# Patient Record
Sex: Female | Born: 1975 | Race: Black or African American | Hispanic: No | Marital: Married | Smoking: Never smoker
Health system: Southern US, Community
[De-identification: ages and names within clinical notes are randomized; demographics above are authoritative.]

## PROBLEM LIST (undated history)

## (undated) ENCOUNTER — Inpatient Hospital Stay (HOSPITAL_COMMUNITY): Payer: Self-pay

## (undated) DIAGNOSIS — N736 Female pelvic peritoneal adhesions (postinfective): Secondary | ICD-10-CM

## (undated) DIAGNOSIS — E229 Hyperfunction of pituitary gland, unspecified: Secondary | ICD-10-CM

## (undated) DIAGNOSIS — Z87820 Personal history of traumatic brain injury: Secondary | ICD-10-CM

## (undated) DIAGNOSIS — R7989 Other specified abnormal findings of blood chemistry: Secondary | ICD-10-CM

## (undated) DIAGNOSIS — I10 Essential (primary) hypertension: Secondary | ICD-10-CM

---

## 1999-05-22 HISTORY — PX: APPENDECTOMY: SHX54

## 2009-04-09 ENCOUNTER — Emergency Department (HOSPITAL_COMMUNITY): Admission: EM | Admit: 2009-04-09 | Discharge: 2009-04-09 | Payer: Self-pay | Admitting: Emergency Medicine

## 2010-04-21 ENCOUNTER — Ambulatory Visit: Payer: Self-pay | Admitting: Obstetrics & Gynecology

## 2010-04-21 ENCOUNTER — Encounter: Payer: Self-pay | Admitting: Physician Assistant

## 2010-04-21 LAB — CONVERTED CEMR LAB: TSH: 1.611 microintl units/mL (ref 0.350–4.500)

## 2010-07-21 ENCOUNTER — Ambulatory Visit: Payer: Self-pay | Admitting: Obstetrics & Gynecology

## 2010-07-21 DIAGNOSIS — N979 Female infertility, unspecified: Secondary | ICD-10-CM

## 2010-08-23 LAB — COMPREHENSIVE METABOLIC PANEL
ALT: 10 U/L (ref 0–35)
Alkaline Phosphatase: 41 U/L (ref 39–117)
CO2: 25 mEq/L (ref 19–32)
GFR calc non Af Amer: >60 mL/min (ref >60)
Glucose, Bld: 72 mg/dL (ref 70–99)
Potassium: 3.8 mEq/L (ref 3.5–5.1)
Sodium: 138 mEq/L (ref 135–145)

## 2010-08-23 LAB — DIFFERENTIAL
Eosinophils Absolute: 0 10*3/uL (ref 0.0–0.7)
Monocytes Absolute: 0.3 10*3/uL (ref 0.1–1.0)
Neutrophils Relative %: 57 % (ref 43–77)

## 2010-08-23 LAB — URINALYSIS, ROUTINE W REFLEX MICROSCOPIC
Glucose, UA: NEGATIVE mg/dL
Ketones, ur: NEGATIVE mg/dL
pH: 7.5 (ref 5.0–8.0)

## 2010-08-23 LAB — CBC
Hemoglobin: 12.1 g/dL (ref 12.0–15.0)
RBC: 5.48 MIL/uL — ABNORMAL HIGH (ref 3.87–5.11)

## 2010-08-23 LAB — LIPASE, BLOOD: Lipase: 20 U/L (ref 11–59)

## 2010-08-23 LAB — POCT PREGNANCY, URINE: Preg Test, Ur: NEGATIVE

## 2010-10-20 ENCOUNTER — Ambulatory Visit (INDEPENDENT_AMBULATORY_CARE_PROVIDER_SITE_OTHER): Payer: Self-pay | Admitting: Obstetrics and Gynecology

## 2010-10-20 ENCOUNTER — Other Ambulatory Visit: Payer: Self-pay | Admitting: Obstetrics and Gynecology

## 2010-10-20 DIAGNOSIS — N979 Female infertility, unspecified: Secondary | ICD-10-CM

## 2010-10-20 DIAGNOSIS — N912 Amenorrhea, unspecified: Secondary | ICD-10-CM

## 2010-10-21 NOTE — Group Therapy Note (Signed)
NAMEDULA, HAVLIK NO.:  [id number]  MEDICAL RECORD NO.:  [id number]           PATIENT TYPE:  A  LOCATION:  [facility]                   FACILITY:  [facility]  PHYSICIAN:  Argentina Donovan, MD        DATE OF BIRTH:  1975-08-30  DATE OF SERVICE:                                 CLINIC NOTE  The patient is a 35 year old African female from [place] who is in for primary infertility.  This is a followup visit after having seen Maylon Cos.  She was started on Clomid for PCOS the last time in discussing with the patient, she seems to have regular periods, although relatively long cycle.  So I am going to continue her on her Clomid. She has  normal FSH, normal prolactin, normal testosterone, free testosterone as yet.  I am not convinced that this lady truly has polycystic ovarian syndrome, although we will see.  I have started her on a temperature chart which she seems very intelligent and able to understand.  We are going to have her come back in a couple of months after taking her temperature and we are going to get a free testosterone today and ultrasound of the ovaries.  Her partner has gone yesterday to [facility], she said for a sperm count, I do not have the results of that, I guess it is under his name, so I have told her to please bring it in the next time she comes.  When she does come in again, if there is good signs of ovulation, she is not conceived and the sperm count is okay, I think hysterosalpingogram should be our next test.  I am going to continue her on 5 mg once a day for day 5 through 9 of Clomid, pending the temperature chart results.  She understands to bring the temperature chart in the next visit.  IMPRESSION:  Primary infertility.          ______________________________ Argentina Donovan, MD    PR/MEDQ  D:  10/20/2010  T:  10/21/2010  Job:  [id number]

## 2010-10-25 ENCOUNTER — Ambulatory Visit (HOSPITAL_COMMUNITY)
Admission: RE | Admit: 2010-10-25 | Discharge: 2010-10-25 | Disposition: A | Discharge status: Home / Self Care | Payer: Self-pay | Source: Ambulatory Visit | Attending: Obstetrics and Gynecology | Admitting: Obstetrics and Gynecology

## 2010-10-25 ENCOUNTER — Other Ambulatory Visit (HOSPITAL_COMMUNITY): Payer: Self-pay

## 2010-10-25 DIAGNOSIS — N912 Amenorrhea, unspecified: Secondary | ICD-10-CM

## 2010-10-25 DIAGNOSIS — N838 Other noninflammatory disorders of ovary, fallopian tube and broad ligament: Secondary | ICD-10-CM | POA: Insufficient documentation

## 2010-10-25 DIAGNOSIS — N946 Dysmenorrhea, unspecified: Secondary | ICD-10-CM | POA: Insufficient documentation

## 2010-10-25 DIAGNOSIS — N979 Female infertility, unspecified: Secondary | ICD-10-CM

## 2011-02-02 ENCOUNTER — Ambulatory Visit (INDEPENDENT_AMBULATORY_CARE_PROVIDER_SITE_OTHER): Payer: Self-pay | Admitting: Obstetrics and Gynecology

## 2011-02-02 VITALS — BP 138/88 | HR 72 | Temp 99.8°F | Ht 64.0 in | Wt 211.5 lb

## 2011-02-02 DIAGNOSIS — E282 Polycystic ovarian syndrome: Secondary | ICD-10-CM

## 2011-02-02 MED ORDER — CLOMIPHENE CITRATE 50 MG PO TABS: 50.0000 mg | ORAL_TABLET | Freq: Every day | ORAL | Status: AC

## 2011-02-02 NOTE — Progress Notes (Signed)
Patient is a 35 year old African female from the [place] being treated for primary infertility. She's been on Clomid 1 daily day 5 through 9 of cycle for the past few months without evidence of ovulation by temperature chart. We rediscussed the temperature chart but made some changes in that she had an ultrasound her last visit which was completely normal as well as in slightly elevated free testosterone. I'm increasing her Clomid 50 mg twice a day from day 5 through 9 of cycle. Her previous cycle started on September 11 therefore tomorrow she will start 2 50 mg tablets twice a day for the next 5 days. She'll complete this for 2 months been return so we can evaluate her charts.  Impression: PCO S. with no evidence of ovulation on one Clomid per day x5.

## 2011-06-22 ENCOUNTER — Encounter: Payer: Self-pay | Admitting: Obstetrics & Gynecology

## 2011-07-18 ENCOUNTER — Encounter: Payer: Self-pay | Admitting: Obstetrics & Gynecology

## 2011-07-18 ENCOUNTER — Ambulatory Visit (INDEPENDENT_AMBULATORY_CARE_PROVIDER_SITE_OTHER): Payer: Self-pay | Admitting: Obstetrics & Gynecology

## 2011-07-18 VITALS — BP 141/95 | HR 67 | Temp 97.4°F | Wt 206.6 lb

## 2011-07-18 DIAGNOSIS — N970 Female infertility associated with anovulation: Secondary | ICD-10-CM

## 2011-07-18 MED ORDER — CLOMIPHENE CITRATE 50 MG PO TABS: ORAL_TABLET | ORAL | Status: DC

## 2011-07-18 NOTE — Progress Notes (Signed)
Patient had LMP 07/03/11- called Radiology to schedule hysterogram , but patient is too far past for this month- patient instructed to call us when she gets her next menstrual cycle and we will schedule it for day 5-7 of cycle. Patient voices understanding.

## 2011-07-18 NOTE — Patient Instructions (Signed)
Hysterosalpingography Hysterosalpingography is an X-ray exam in which a dye is injected into the cervix. This is done to view the inside of the uterus. This may help your caregiver determine whether you have uterine tumors, adhesions, or structural abnormalities. It may also help determine reasons for infertility. Complications from this test can include:  Infection in the lining of the fallopian tubes.   A perforation of the uterus.   An allergic reaction to the dye that is used to perform the X-ray.  PREPARATION FOR TEST No preparation or fasting is needed. However, tell the person performing the test if you think you may be pregnant.  NORMAL FINDINGS  Normal fallopian tubes.   No defects in the uterine cavity.  Ranges for normal findings may vary among different laboratories and hospitals. You should always check with your caregiver after having lab work or other tests done to discuss the meaning of your test results and whether your values are considered within normal limits. MEANING OF TEST  Your caregiver will go over the test results with you. Your caregiver will discuss the importance and meaning of your results. He or she will also discuss treatment options and additional tests, if needed. OBTAINING THE TEST RESULTS It is your responsibility to obtain your test results. Ask the lab or department performing the test when and how you will get your results. Document Released: 06/09/2004 Document Revised: 01/17/2011 Document Reviewed: 04/17/2008 Exit[employer] Patient Information 2012 [employer]. 

## 2011-07-18 NOTE — Progress Notes (Signed)
  Subjective:    Patient ID: Jamie Mcbride, female    DOB: [birth year], 36 y.o.   MRN: [id number]  HPI G0P0000 Patient's last menstrual period was 07/03/2011. Patient returns today to review her progress using Clomid. She saw Dr. Okey Dupre in September and he increased her dose of Clomid to 100 mg on cycle day 5 through 9. She has normal withdrawal bleeds monthly basis. She's been using Clomid for nearly a year now. She's never conceived. She says her husband has had a semen analysis which was normal. She's never had a hysterosalpingogram.  Past Medical History  Diagnosis Date  . History of appendectomy 2001   Past Surgical History  Procedure Date  . Appendectomy 2001   Current outpatient prescriptions:folic acid (FOLVITE) 400 MCG tablet, Take 400 mcg by mouth daily.  , Disp: , Rfl: ;  Multiple Vitamin (MULTIVITAMIN) tablet, Take 1 tablet by mouth daily.  , Disp: , Rfl:  No Known Allergies   Review of Systems Regular cycles    Objective:   Physical Exam Filed Vitals:   07/18/11 1300 07/18/11 1303  BP: 150/94 141/95  Pulse: 75 67  Temp: 97.4 F (36.3 C)   TempSrc: Oral   Weight: 206 lb 9.6 oz (93.713 kg)    normal affect no acute distress Physical exam was deferred today.       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 She has not conceived on multiple cycles using Clomid. She will be scheduled for hysterosalpingogram. After that I will likely refer her to infertility and endocrinology at [facility].  Tayte Childers

## 2011-07-18 NOTE — Progress Notes (Signed)
Addended by: Adam Phenix on: 07/18/2011 01:49 PM   Modules accepted: Orders

## 2011-08-07 ENCOUNTER — Telehealth: Payer: Self-pay | Admitting: *Deleted

## 2011-08-07 NOTE — Telephone Encounter (Signed)
Called pt re: hysterosalpingogram and clinic appt.  Pt states that her LMP is 08/03/11 and would like to schedule the test. I made appt for 3/21 however pt is not able to pay the required $695 on [facility] of exam. She will begin saving the money for the HSG and call back when her next period begins (if she has the funds) so that HSG can be scheduled on days 7-10 of cycle. Her next appt on 4/10 will be postponed until after her HSG is completed. Pt agreed and voiced understanding.

## 2011-08-07 NOTE — Telephone Encounter (Signed)
Message copied by Jill Side on Tue Aug 07, 2011  2:18 PM ------      Message from: Jamie Mcbride      Created: Mon Aug 06, 2011  3:45 PM       Pt was scheduled with me for Friday. Needs hysterosalpingogram, not FU appt. Order in [facility], please call to schedule and call to inform pt. She has FU to review results with Debroah Loop on 4/10 Thanks!

## 2011-08-09 ENCOUNTER — Ambulatory Visit (HOSPITAL_COMMUNITY): Payer: Self-pay

## 2011-08-10 ENCOUNTER — Ambulatory Visit: Payer: Self-pay | Admitting: Physician Assistant

## 2011-08-29 ENCOUNTER — Encounter: Payer: Self-pay | Admitting: Obstetrics & Gynecology

## 2011-11-26 ENCOUNTER — Encounter: Payer: Self-pay | Admitting: Obstetrics & Gynecology

## 2011-11-26 ENCOUNTER — Ambulatory Visit (INDEPENDENT_AMBULATORY_CARE_PROVIDER_SITE_OTHER): Payer: Self-pay | Admitting: Obstetrics & Gynecology

## 2011-11-26 ENCOUNTER — Encounter: Payer: Self-pay | Admitting: *Deleted

## 2011-11-26 VITALS — BP 140/81 | HR 74 | Temp 99.0°F | Ht 67.0 in | Wt 222.3 lb

## 2011-11-26 DIAGNOSIS — O0900 Supervision of pregnancy with history of infertility, unspecified trimester: Secondary | ICD-10-CM | POA: Insufficient documentation

## 2011-11-26 DIAGNOSIS — Z3201 Encounter for pregnancy test, result positive: Secondary | ICD-10-CM

## 2011-11-26 DIAGNOSIS — Z3200 Encounter for pregnancy test, result unknown: Secondary | ICD-10-CM

## 2011-11-26 LAB — POCT PREGNANCY, URINE: Preg Test, Ur: POSITIVE — AB

## 2011-11-26 NOTE — Progress Notes (Signed)
  Subjective:    Patient ID: Margot Chimes, female    DOB: 1975/08/30, 36 y.o.   MRN: [id number]  HPI G1P0000 Patient's last menstrual period was 09/05/2011. Patient last took Clomid early May. She had positive UPT in June. Some nausea and backache.  Current Outpatient Prescriptions on File Prior to Visit  Medication Sig Dispense Refill  . folic acid (FOLVITE) 400 MCG tablet Take 400 mcg by mouth daily.        . Multiple Vitamin (MULTIVITAMIN) tablet Take 1 tablet by mouth daily.         No Known Allergies Past Medical History  Diagnosis Date  . History of appendectomy 2001   Family History  Problem Relation Age of Onset  . Diabetes Father   . Hypertension Father    History   Social History  . Marital Status: Married    Spouse Name: N/A    Number of Children: N/A  . Years of Education: N/A   Occupational History  . Not on file.   Social History Main Topics  . Smoking status: Never Smoker   . Smokeless tobacco: Never Used  . Alcohol Use: No  . Drug Use: No  . Sexually Active: Yes -- Female partner(s)    Birth Control/ Protection: None   Other Topics Concern  . Not on file   Social History Narrative  . No narrative on file     Review of Systems As above     Objective:   Physical Exam Filed Vitals:   11/26/11 1329 11/26/11 1334  BP: 159/107 140/81  Pulse: 93 74  Temp: 99 F (37.2 C)   TempSrc: Oral   Height: 5\' 7"  (1.702 m)   Weight: 222 lb 4.8 oz (100.835 kg)    NAD, pleasant  Abdomen: obese, no mass, no FHT heard  Pelvic deferred        Assessment & Plan:  11 week 5 day gest by dates. H/O infertility  Seek [facility] Vitamins  Domini Vandehei 11/26/2011 2:01 PM

## 2011-11-26 NOTE — Patient Instructions (Signed)
Prenatal Care  WHAT IS PRENATAL CARE?  Prenatal care means health care during your pregnancy, before your baby is born. Take care of yourself and your baby by:   Getting early prenatal care. If you know you are pregnant, or think you might be pregnant, call your caregiver as soon as possible. Schedule a visit for a general/prenatal examination.   Getting regular prenatal care. Follow your caregiver's schedule for blood and other necessary tests. Do not miss appointments.   Do everything you can to keep yourself and your baby healthy during your pregnancy.   Prenatal care should include evaluation of medical, dietary, educational, psychological, and social needs for the couple and the medical, surgical, and genetic history of the family of the mother and father.   Discuss with your caregiver:   Your medicines, prescription, over-the-counter, and herbal medicines.   Substance abuse, alcohol, smoking, and illegal drugs.   Domestic abuse and violence, if present.   Your immunizations.   Nutrition and diet.   Exercising.   Environment and occupational hazards, at home and at work.   History of sexually transmitted disease, both you and your partner.   Previous pregnancies.  WHY IS PRENATAL CARE SO IMPORTANT?  By seeing you regularly, your caregiver has the chance to find problems early, so that they can be treated as soon as possible. Other problems might be prevented. Many studies have shown that early and regular prenatal care is important for the health of both mothers and their babies.  I AM THINKING ABOUT GETTING PREGNANT. HOW CAN I TAKE CARE OF MYSELF?  Taking care of yourself before you get pregnant helps you to have a healthy pregnancy. It also lowers your chances of having a baby born with a birth defect. Here are ways to take care of yourself before you get pregnant:   Eat healthy foods, exercise regularly (30 minutes per day for most days of the week is best), and get enough  rest and sleep. Talk to your caregiver about what kinds of foods and exercises are best for you.   Take 400 micrograms (mcg) of folic acid (one of the B vitamins) every day. The best way to do this is to take a daily multivitamin pill that contains this amount of folic acid. Getting enough of the synthetic (manufactured) form of folic acid every day before you get pregnant and during early pregnancy can help prevent certain birth defects. Many breakfast cereals and other grain products have folic acid added to them, but only certain cereals contain 400 mcg of folic acid per serving. Check the label on your multivitamin or cereal to find the amount of folic acid in the food.   See your caregiver for a complete check up before getting pregnant. Make sure that you have had all your immunization shots, especially for rubella (German measles). Rubella can cause serious birth defects. Chickenpox is another illness you want to avoid during pregnancy. If you have had chickenpox and rubella in the past, you should be immune to them.   Tell your caregiver about any prescription or non-prescription medicines (including herbal remedies) you are taking. Some medicines are not safe to take during pregnancy.   Stop smoking cigarettes, drinking alcohol, or taking illegal drugs. Ask your caregiver for help, if you need it. You can also get help with alcohol and drugs by talking with a member of your faith community, a counselor, or a trusted friend.   Discuss and treat any medical, social, or psychological   problems before getting pregnant.   Discuss any history of genetic problems in the mother, father, and their families. Do genetic testing before getting pregnant, when possible.   Discuss any physical or emotional abuse with your caregiver.   Discuss with your caregiver if you might be exposed to harmful chemicals on your job or where you live.   Discuss with your caregiver if you think your job or the hours you  work may be harmful and should be changed.   The father should be involved with the decision making and with all aspects of the pregnancy, labor, and delivery.   If you have medical insurance, make sure you are covered for pregnancy.  I JUST FOUND OUT THAT I AM PREGNANT. HOW CAN I TAKE CARE OF MYSELF?  Here are ways to take care of yourself and the precious new life growing inside you:   Continue taking your multivitamin with 400 micrograms (mcg) of folic acid every day.   Get early and regular prenatal care. It does not matter if this is your first pregnancy or if you already have children. It is very important to see a caregiver during your pregnancy. Your caregiver will check at each visit to make sure that you and the baby are healthy. If there are any problems, action can be taken right away to help you and the baby.   Eat a healthy diet that includes:   Fruits.   Vegetables.   Foods low in saturated fat.   Grains.   Calcium-rich foods.   Drink 6 to 8 glasses of liquids a day.   Unless your caregiver tells you not to, try to be physically active for 30 minutes, most days of the week. If you are pressed for time, you can get your activity in through 10 minute segments, three times a day.   If you smoke, drink alcohol, or use drugs, STOP. These can cause long-term damage to your baby. Talk with your caregiver about steps to take to stop smoking. Talk with a member of your faith community, a counselor, a trusted friend, or your caregiver if you are concerned about your alcohol or drug use.   Ask your caregiver before taking any medicine, even over-the-counter medicines. Some medicines are not safe to take during pregnancy.   Get plenty of rest and sleep.   Avoid hot tubs and saunas during pregnancy.   Do not have X-rays taken, unless absolutely necessary and with the recommendation of your caregiver. A lead shield can be placed on your abdomen, to protect the baby when X-rays  are taken in other parts of the body.   Do not empty the cat litter when you are pregnant. It may contain a parasite that causes an infection called toxoplasmosis, which can cause birth defects. Also, use gloves when working in garden areas used by cats.   Do not eat uncooked or undercooked cheese, meats, or fish.   Stay away from toxic chemicals like:   Insecticides.   Solvents (some cleaners or paint thinners).   Lead.   Mercury.   Sexual relations may continue until the end of the pregnancy, unless you have a medical problem or there is a problem with the pregnancy and your caregiver tells you not to.   Do not wear high heel shoes, especially during the second half of the pregnancy. You can lose your balance and fall.   Do not take long trips, unless absolutely necessary. Be sure to see your caregiver before   going on the trip.   Do not sit in one position for more than 2 hours, when on a trip.   Take a copy of your medical records when going on a trip.   Know where there is a hospital in the city you are visiting, in case of an emergency.   Most dangerous household products will have pregnancy warnings on their labels. Ask your caregiver about products if you are unsure.   Limit or eliminate your caffeine intake from coffee, tea, sodas, medicines, and chocolate.   Many women continue working through pregnancy. Staying active might help you stay healthier. If you have a question about the safety or the hours you work at your particular job, talk with your caregiver.   Get informed:   Read books.   Watch videos.   Go to childbirth classes for you and the father.   Talk with experienced moms.   Ask your caregiver about childbirth education classes for you and your partner. Classes can help you and your partner prepare for the birth of your baby.   Ask about a pediatrician (baby doctor) and methods and pain medicine for labor, delivery, and possible Cesarean delivery  (C-section).  I AM NOT THINKING ABOUT GETTING PREGNANT RIGHT NOW, BUT HEARD THAT ALL WOMEN SHOULD TAKE FOLIC ACID EVERY DAY?  All women of childbearing age, with even a remote chance of getting pregnant, should try to make sure they get enough folic acid. Many pregnancies are not planned. Many women do not know they are actually pregnant early in their pregnancies, and certain birth defects happen in the very early part of pregnancy. Taking 400 micrograms (mcg) of folic acid every day will help prevent certain birth defects that happen in the early part of pregnancy. If a woman begins taking vitamin pills in the second or third month of pregnancy, it may be too late to prevent birth defects. Folic acid may also have other health benefits for women, besides preventing birth defects.  HOW OFTEN SHOULD I SEE MY CAREGIVER DURING PREGNANCY?  Your caregiver will give you a schedule for your prenatal visits. You will have visits more often as you get closer to the end of your pregnancy. An average pregnancy lasts about 40 weeks.  A typical schedule includes visiting your caregiver:   About once each month, during your first 6 months of pregnancy.   Every 2 weeks, during the next 2 months.   Weekly in the last month, until the delivery date.  Your caregiver will probably want to see you more often if:  You are over 35.   Your pregnancy is high risk, because you have certain health problems or problems with the pregnancy, such as:   Diabetes.   High blood pressure.   The baby is not growing on schedule, according to the dates of the pregnancy.  Your caregiver will do special tests, to make sure you and the baby are not having any serious problems. WHAT HAPPENS DURING PRENATAL VISITS?   At your first prenatal visit, your caregiver will talk to you about you and your partner's health history and your family's health history, and will do a physical exam.   On your first visit, a physical exam will  include checks of your blood pressure, height and weight, and an exam of your pelvic organs. Your caregiver will do a Pap test if you have not had one recently, and will do cultures of your cervix to make sure there is no   infection.   At each visit, there will be tests of your blood, urine, blood pressure, weight, and checking the progress of the baby.   Your caregiver will be able to tell you when to expect that your baby will be born.   Each visit is also a chance for you to learn about staying healthy during pregnancy and for asking questions.   Discuss whether you will be breastfeeding.   At your later prenatal visits, your caregiver will check how you are doing and how the baby is developing. You may have a number of tests done as your pregnancy progresses.   Ultrasound exams are often used to check on the baby's growth and health.   You may have more urine and blood tests, as well as special tests, if needed. These may include amniocentesis (examine fluid in the pregnancy sac), stress tests (check how baby responds to contractions), biophysical profile (measures fetus well-being). Your caregiver will explain the tests and why they are necessary.  I AM IN MY LATE THIRTIES, AND I WANT TO HAVE A CHILD NOW. SHOULD I DO ANYTHING SPECIAL?  As you get older, there is more chance of having a medical problem (high blood pressure), pregnancy problem (preeclampsia, problems with the placenta), miscarriage, or a baby born with a birth defect. However, most women in their late thirties and early forties have healthy babies. See your caregiver on a regular basis before you get pregnant and be sure to go for exams throughout your pregnancy. Your caregiver probably will want to do some special tests to check on you and your baby's health when you are pregnant.  Women today are often delaying having children until later in life, when they are in their thirties and forties. While many women in their thirties  and forties have no difficulty getting pregnant, fertility does decline with age. For women over 40 who cannot get pregnant after 6 months of trying, it is recommended that they see their caregiver for a fertility evaluation. It is not uncommon to have trouble becoming pregnant or experience infertility (inability to become pregnant after trying for one year). If you think that you or your partner may be infertile, you can discuss this with your caregiver. He or she can recommend treatments such as drugs, surgery, or assisted reproductive technology.  Document Released: 05/10/2003 Document Revised: 04/26/2011 Document Reviewed: 04/06/2009 [employer] Patient Information 2012 [employer]. 

## 2011-11-30 ENCOUNTER — Inpatient Hospital Stay (HOSPITAL_COMMUNITY)
Admission: AD | Admit: 2011-11-30 | Discharge: 2011-11-30 | Disposition: A | Discharge status: Home / Self Care | Payer: Medicaid Other | Source: Ambulatory Visit | Attending: Obstetrics & Gynecology | Admitting: Obstetrics & Gynecology

## 2011-11-30 ENCOUNTER — Inpatient Hospital Stay (HOSPITAL_COMMUNITY): Payer: Medicaid Other

## 2011-11-30 ENCOUNTER — Encounter (HOSPITAL_COMMUNITY): Payer: Self-pay | Admitting: *Deleted

## 2011-11-30 DIAGNOSIS — O209 Hemorrhage in early pregnancy, unspecified: Secondary | ICD-10-CM

## 2011-11-30 DIAGNOSIS — O039 Complete or unspecified spontaneous abortion without complication: Secondary | ICD-10-CM

## 2011-11-30 LAB — CBC WITH DIFFERENTIAL/PLATELET
Eosinophils Relative: 1 % (ref 0–5)
Lymphs Abs: 1.6 10*3/uL (ref 0.7–4.0)
MCH: 20.9 pg — ABNORMAL LOW (ref 26.0–34.0)
MCHC: 32.7 g/dL (ref 30.0–36.0)
MCV: 63.9 fL — ABNORMAL LOW (ref 78.0–100.0)
Monocytes Absolute: 0.4 10*3/uL (ref 0.1–1.0)
Platelets: 131 10*3/uL — ABNORMAL LOW (ref 150–400)
RBC: 5.51 MIL/uL — ABNORMAL HIGH (ref 3.87–5.11)

## 2011-11-30 LAB — WET PREP, GENITAL
Trich, Wet Prep: NONE SEEN
Yeast Wet Prep HPF POC: NONE SEEN

## 2011-11-30 NOTE — MAU Note (Signed)
Pt states she had a little bit of blood on tissue last pm before bedtime. States a large gush of blood today with very little pain.

## 2011-11-30 NOTE — MAU Provider Note (Signed)
History     CSN: [id number]  Arrival date and time: 11/30/11 1527   First Provider Initiated Contact with Patient 11/30/11 1608      Chief Complaint  Patient presents with  . Vaginal Bleeding   HPI Pt presents today 1 day history of vaginal bleeding and lower abdominal pain/cramping. She noticed blood on the toilet paper after urinating this morning, accompanied by the abdominal pain. 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 While in triage she had a "gush" of vaginal bleeding and increase of crampy-pain.   OB History    Grav Para Term Preterm Abortions TAB SAB Ect Mult Living   1 0 0 0 0 0 0 0 0 0       Past Medical History  Diagnosis Date  . History of appendectomy 2001  . Anemia     Past Surgical History  Procedure Date  . Appendectomy 2001  . Appendectomy     Family History  Problem Relation Age of Onset  . Diabetes Father   . Hypertension Father   . Other Neg Hx     History  Substance Use Topics  . Smoking status: Never Smoker   . Smokeless tobacco: Never Used  . Alcohol Use: No    Allergies: No Known Allergies  Prescriptions prior to admission  Medication Sig Dispense Refill  . folic acid (FOLVITE) 400 MCG tablet Take 400 mcg by mouth daily.        . Multiple Vitamin (MULTIVITAMIN) tablet Take 1 tablet by mouth daily.          Review of Systems  All other systems reviewed and are negative.   Physical Exam   Blood pressure 142/83, pulse 76, temperature 98.7 F (37.1 C), temperature source Oral, resp. rate 18, height 5' 4.5" (1.638 m), weight 99.61 kg (219 lb 9.6 oz), last menstrual period 09/05/2011.  Physical Exam  Nursing note and vitals reviewed. Constitutional: She is oriented to person, place, and time. She appears well-developed and well-nourished. No distress (pleasant, but nervous about the bleeding and wellbeing of the baby).  HENT:  Head: Normocephalic.  Eyes: EOM are normal.    Cardiovascular: Normal rate and regular rhythm.   Respiratory: Effort normal.  GI: Soft. Normal appearance and bowel sounds are normal. There is tenderness in the right lower quadrant, suprapubic area and left lower quadrant.  Genitourinary:       External genitalia without lesions. Small blood vaginal vault. Cervix closed, long, no CMT.   Musculoskeletal: Normal range of motion.  Neurological: She is alert and oriented to person, place, and time.  Skin: Skin is warm and dry.  Psychiatric: She has a normal mood and affect. Her behavior is normal. Judgment and thought content normal.    Results for orders placed during the hospital encounter of 11/30/11 (from the past 24 hour(s))  CBC WITH DIFFERENTIAL     Status: Abnormal   Collection Time   11/30/11  4:30 PM      Component Value Range   WBC 5.3  4.0 - 10.5 K/uL   RBC 5.51 (*) 3.87 - 5.11 MIL/uL   Hemoglobin 11.5 (*) 12.0 - 15.0 g/dL   HCT 84.6 (*) 96.2 - 95.2 %   MCV 63.9 (*) 78.0 - 100.0 fL   MCH 20.9 (*) 26.0 - 34.0 pg   MCHC 32.7  30.0 - 36.0 g/dL   RDW 84.1 (*) 32.4 - 40.1 %   Platelets 131 (*) 150 -  400 K/uL   Neutrophils Relative 60  43 - 77 %   Lymphocytes Relative 31  12 - 46 %   Monocytes Relative 8  3 - 12 %   Eosinophils Relative 1  0 - 5 %   Basophils Relative 0  0 - 1 %   Neutro Abs 3.2  1.7 - 7.7 K/uL   Lymphs Abs 1.6  0.7 - 4.0 K/uL   Monocytes Absolute 0.4  0.1 - 1.0 K/uL   Eosinophils Absolute 0.1  0.0 - 0.7 K/uL   Basophils Absolute 0.0  0.0 - 0.1 K/uL   Smear Review MORPHOLOGY UNREMARKABLE    ABO/RH     Status: Normal   Collection Time   11/30/11  4:30 PM      Component Value Range   ABO/RH(D) O POS    WET PREP, GENITAL     Status: Abnormal   Collection Time   11/30/11  5:37 PM      Component Value Range   Yeast Wet Prep HPF POC NONE SEEN  NONE SEEN   Trich, Wet Prep NONE SEEN  NONE SEEN   Clue Cells Wet Prep HPF POC FEW (*) NONE SEEN   WBC, Wet Prep HPF POC FEW (*) NONE SEEN    US Ob Comp Less 14  Wks  11/30/2011  *RADIOLOGY REPORT*  Clinical Data: Heavy vaginal bleeding.  12-week-2-day gestational age by LMP.  OBSTETRIC <14 WK [place] AND TRANSVAGINAL OB US  Technique:  Both transabdominal and transvaginal ultrasound examinations were performed for complete evaluation of the gestation as well as the maternal uterus, adnexal regions, and pelvic cul-de-sac.  Transvaginal technique was performed to assess early pregnancy.  Comparison:  None.  Findings:  Uterus is retroverted.  Thickened and heterogeneous appearing endometrium is seen, but there is no evidence of intrauterine gestational sac.  No other fluid collections seen within endometrial cavity.  No fibroids identified.  A 2.4 cm thick-walled cyst is seen in the right ovary, which is inseparable from the ovary, and most likely represents a corpus luteum.  There is a small amount of free fluid in the adnexa and pelvic cul-de-sac, but no definite adnexal mass separate from ovary is visualized.  The left ovary is normal appearance.  IMPRESSION: A small amount of free fluid, but no IUP or definite adnexal mass identified.  Differential diagnosis includes recent spontaneous abortion, IUP too early to visualize, and occult ectopic pregnancy. Recommend close follow-up of quantitative B-HCG levels, and followup US as clinically warranted.  Original Report Authenticated By: Danae Orleans, M.D.   US Ob Transvaginal  11/30/2011  *RADIOLOGY REPORT*  Clinical Data: Heavy vaginal bleeding.  12-week-2-day gestational age by LMP.  OBSTETRIC <14 WK [place] AND TRANSVAGINAL OB US  Technique:  Both transabdominal and transvaginal ultrasound examinations were performed for complete evaluation of the gestation as well as the maternal uterus, adnexal regions, and pelvic cul-de-sac.  Transvaginal technique was performed to assess early pregnancy.  Comparison:  None.  Findings:  Uterus is retroverted.  Thickened and heterogeneous appearing endometrium is seen, but there is no evidence  of intrauterine gestational sac.  No other fluid collections seen within endometrial cavity.  No fibroids identified.  A 2.4 cm thick-walled cyst is seen in the right ovary, which is inseparable from the ovary, and most likely represents a corpus luteum.  There is a small amount of free fluid in the adnexa and pelvic cul-de-sac, but no definite adnexal mass separate from ovary is visualized.  The left ovary is normal appearance.  IMPRESSION: A small amount of free fluid, but no IUP or definite adnexal mass identified.  Differential diagnosis includes recent spontaneous abortion, IUP too early to visualize, and occult ectopic pregnancy. Recommend close follow-up of quantitative B-HCG levels, and followup US as clinically warranted.  Original Report Authenticated By: Danae Orleans, M.D.   MAU Course  Procedures  MDM Assessment and Plan   Assessment:  36 y.o. female with first trimester bleeding  Diff Dx: SAB   Ectopic pregnancy   Early IUP  Plan:  Will have patient return in 48 hours to repeat Bhcg   Ectopic precautions  [place] 11/30/2011, 4:30 PM   I have reviewed this patient's vital signs, nurses notes, appropriate labs and imaging. I have examined this patient and assisted the student with order management. I assumed care of the patent prior to patient returning from ultrasound and prior to pelvic exam.  Bhcg results for today are pending. Will discuss results of today's Bhcg and compare with repeat to determine plan of care.

## 2011-12-01 LAB — GC/CHLAMYDIA PROBE AMP, GENITAL
Chlamydia, DNA Probe: NEGATIVE
GC Probe Amp, Genital: NEGATIVE

## 2011-12-02 ENCOUNTER — Inpatient Hospital Stay (HOSPITAL_COMMUNITY)
Admission: AD | Admit: 2011-12-02 | Discharge: 2011-12-02 | Disposition: A | Discharge status: Home / Self Care | Payer: Medicaid Other | Source: Ambulatory Visit | Attending: Obstetrics & Gynecology | Admitting: Obstetrics & Gynecology

## 2011-12-02 DIAGNOSIS — O039 Complete or unspecified spontaneous abortion without complication: Secondary | ICD-10-CM | POA: Insufficient documentation

## 2011-12-02 NOTE — MAU Provider Note (Signed)
  History     CSN: [id number]  Arrival date and time: 12/02/11 0944   None     No chief complaint on file.  HPI Jamie Mcbride is 36 y.o. G1P0000 [redacted]w[redacted]d weeks presenting for followup BHCG.  She was seen 11/30/11 with bleeding and + UPT.  Hx of infertility.  On 7/12 BHCG 1312.  U/S did not see IUP or adnexal mass with a small amt of FF.  O+ blood type.     Past Medical History  Diagnosis Date  . History of appendectomy 2001  . Anemia     Past Surgical History  Procedure Date  . Appendectomy 2001  . Appendectomy     Family History  Problem Relation Age of Onset  . Diabetes Father   . Hypertension Father   . Other Neg Hx     History  Substance Use Topics  . Smoking status: Never Smoker   . Smokeless tobacco: Never Used  . Alcohol Use: No    Allergies: No Known Allergies  Prescriptions prior to admission  Medication Sig Dispense Refill  . folic acid (FOLVITE) 400 MCG tablet Take 400 mcg by mouth daily.        . Multiple Vitamin (MULTIVITAMIN) tablet Take 1 tablet by mouth daily.          Review of Systems  Gastrointestinal: Negative for abdominal pain.  Genitourinary:       + for vaginal bleeding   Physical Exam   Last menstrual period 09/05/2011.  Physical Exam  Constitutional: She is oriented to person, place, and time. She appears well-developed and well-nourished.  Neurological: She is alert and oriented to person, place, and time.  Psychiatric: She has a normal mood and affect. Her behavior is normal.   Results for orders placed during the hospital encounter of 12/02/11 (from the past 24 hour(s))  HCG, QUANTITATIVE, PREGNANCY     Status: Abnormal   Collection Time   12/02/11 10:06 AM      Component Value Range   hCG, Beta Chain, Quant, S 449 (*) <5 mIU/mL   MAU Course  Procedures  MDM Reviewed results with the patient.  Assessment and Plan  A:  Spontaneous Abortion  P:  Return for heavy vaginal bleeding or increased pain.  At discharge  patient states she has appt with [facility] for Tuesday.  Encouraged her to keep that appointment to have follow up for miscarriage.  I wrote her BHCG numbers down her her to give to them.      KEY,EVE M 12/02/2011, 9:48 AM

## 2011-12-02 NOTE — ED Notes (Signed)
E.Key,NP discussed results and POC with pt.  Verbalized understanding.

## 2011-12-02 NOTE — MAU Note (Signed)
Pt reports bleeding has gotten darker but not as heavy. Reports still having some cramping.

## 2011-12-29 ENCOUNTER — Encounter (HOSPITAL_COMMUNITY): Payer: Self-pay

## 2011-12-29 ENCOUNTER — Inpatient Hospital Stay (HOSPITAL_COMMUNITY)
Admission: AD | Admit: 2011-12-29 | Discharge: 2011-12-29 | Disposition: A | Discharge status: Home / Self Care | Payer: Medicaid Other | Source: Ambulatory Visit | Attending: Obstetrics & Gynecology | Admitting: Obstetrics & Gynecology

## 2011-12-29 DIAGNOSIS — M549 Dorsalgia, unspecified: Secondary | ICD-10-CM | POA: Insufficient documentation

## 2011-12-29 DIAGNOSIS — O289 Unspecified abnormal findings on antenatal screening of mother: Secondary | ICD-10-CM | POA: Insufficient documentation

## 2011-12-29 DIAGNOSIS — O021 Missed abortion: Secondary | ICD-10-CM

## 2011-12-29 DIAGNOSIS — O200 Threatened abortion: Secondary | ICD-10-CM | POA: Insufficient documentation

## 2011-12-29 DIAGNOSIS — R51 Headache: Secondary | ICD-10-CM

## 2011-12-29 LAB — HCG, QUANTITATIVE, PREGNANCY: hCG, Beta Chain, Quant, S: 13 m[IU]/mL — ABNORMAL HIGH (ref <5)

## 2011-12-29 LAB — CBC
MCHC: 32.8 g/dL (ref 30.0–36.0)
Platelets: 112 10*3/uL — ABNORMAL LOW (ref 150–400)
RDW: 15.6 % — ABNORMAL HIGH (ref 11.5–15.5)

## 2011-12-29 LAB — URINE MICROSCOPIC-ADD ON

## 2011-12-29 LAB — URINALYSIS, ROUTINE W REFLEX MICROSCOPIC
Glucose, UA: NEGATIVE mg/dL
Ketones, ur: NEGATIVE mg/dL
pH: 6 (ref 5.0–8.0)

## 2011-12-29 MED ORDER — KETOROLAC TROMETHAMINE 60 MG/2ML IM SOLN
INTRAMUSCULAR | Status: AC
  Filled 2011-12-29: qty 2

## 2011-12-29 MED ORDER — ACETAMINOPHEN 500 MG PO TABS
1000.0000 mg | ORAL_TABLET | Freq: Once | ORAL | Status: AC
  Administered 2011-12-29: 1000 mg via ORAL
  Filled 2011-12-29: qty 2

## 2011-12-29 MED ORDER — IBUPROFEN 600 MG PO TABS: 600.0000 mg | ORAL_TABLET | Freq: Four times a day (QID) | ORAL | Status: AC | PRN

## 2011-12-29 MED ORDER — KETOROLAC TROMETHAMINE 60 MG/2ML IM SOLN
60.0000 mg | Freq: Once | INTRAMUSCULAR | Status: AC
  Administered 2011-12-29: 60 mg via INTRAMUSCULAR

## 2011-12-29 NOTE — MAU Note (Signed)
Patient states having back pain since Tuesday and spotting, patient was told going to have a miscarriage, headache

## 2011-12-29 NOTE — MAU Provider Note (Signed)
History     CSN: [id number]  Arrival date and time: 12/29/11 4098   First Provider Initiated Contact with Patient 12/29/11 [phone]      Chief Complaint  Patient presents with  . Vaginal Bleeding   HPI Jamie Mcbride 36 y.o. Was seen in MAU last on 12-02-11.  Comes today with dizziness, vaginal spotting, headache and low midline back pain.  No abdominal cramping.  Has been seen in the office - [facility] - since being here in MAU.  Quants were 11-30-11 - 1312.  Quant on 12-02-11 was 449.  No IUP was seen on ultrasound on 12-02-11.  Client was told she would have a miscarriage.  Client states the office called her yesterday with blood work results and said she needed to come to MAU for an ultrasound.  Could not come yesterday so she was not feeling well today and came today.  No ultrasound was scheduled so she was directed to come to MAU.  OB History    Grav Para Term Preterm Abortions TAB SAB Ect Mult Living   1 0 0 0 0 0 0 0 0 0       Past Medical History  Diagnosis Date  . History of appendectomy 2001    Past Surgical History  Procedure Date  . Appendectomy 2001  . Appendectomy     Family History  Problem Relation Age of Onset  . Diabetes Father   . Hypertension Father   . Other Neg Hx     History  Substance Use Topics  . Smoking status: Never Smoker   . Smokeless tobacco: Never Used  . Alcohol Use: No    Allergies: No Known Allergies  Prescriptions prior to admission  Medication Sig Dispense Refill  . acetaminophen (TYLENOL) 325 MG tablet Take 650 mg by mouth every 6 (six) hours as needed. For pain      . Prenatal Vit-Fe Fumarate-FA (PRENATAL MULTIVITAMIN) TABS Take 1 tablet by mouth at bedtime.        Review of Systems  Gastrointestinal: Negative for nausea, vomiting and abdominal pain.  Genitourinary: Negative for dysuria.       Vaginal bleeding  Musculoskeletal: Positive for back pain.  Neurological: Positive for dizziness and headaches.   Physical Exam    Blood pressure 172/90, pulse 66, temperature 98.4 F (36.9 C), resp. rate 16, last menstrual period 09/05/2011, unknown if currently breastfeeding.  Physical Exam  Nursing note and vitals reviewed. Constitutional: She is oriented to person, place, and time. She appears well-developed and well-nourished.  HENT:  Head: Normocephalic.  Eyes: EOM are normal.  Neck: Neck supple.  GI: Soft. There is no tenderness.  Musculoskeletal: Normal range of motion.       Low midline back pain, no bruising or edema  Neurological: She is alert and oriented to person, place, and time.  Skin: Skin is warm and dry.  Psychiatric: She has a normal mood and affect.    MAU Course  Procedures Results for orders placed during the hospital encounter of 12/29/11 (from the past 24 hour(s))  URINALYSIS, ROUTINE W REFLEX MICROSCOPIC     Status: Abnormal   Collection Time   12/29/11  8:45 AM      Component Value Range   Color, Urine YELLOW  YELLOW   APPearance HAZY (*) CLEAR   Specific Gravity, Urine 1.015  1.005 - 1.030   pH 6.0  5.0 - 8.0   Glucose, UA NEGATIVE  NEGATIVE mg/dL   Hgb urine dipstick MODERATE (*)  NEGATIVE   Bilirubin Urine NEGATIVE  NEGATIVE   Ketones, ur NEGATIVE  NEGATIVE mg/dL   Protein, ur NEGATIVE  NEGATIVE mg/dL   Urobilinogen, UA 0.2  0.0 - 1.0 mg/dL   Nitrite NEGATIVE  NEGATIVE   Leukocytes, UA LARGE (*) NEGATIVE  URINE MICROSCOPIC-ADD ON     Status: Abnormal   Collection Time   12/29/11  8:45 AM      Component Value Range   Squamous Epithelial / LPF MANY (*) RARE   WBC, UA 7-10  <3 WBC/hpf   RBC / HPF 3-6  <3 RBC/hpf   Bacteria, UA FEW (*) RARE  CBC     Status: Abnormal (Preliminary result)   Collection Time   12/29/11  9:33 AM      Component Value Range   WBC 3.9 (*) 4.0 - 10.5 K/uL   RBC 5.86 (*) 3.87 - 5.11 MIL/uL   Hemoglobin 12.2  12.0 - 15.0 g/dL   HCT 46.9  62.9 - 52.8 %   MCV 63.5 (*) 78.0 - 100.0 fL   MCH 20.8 (*) 26.0 - 34.0 pg   MCHC 32.8  30.0 - 36.0 g/dL    RDW 41.3 (*) 24.4 - 15.5 %   Platelets PENDING  150 - 400 K/uL  HCG, QUANTITATIVE, PREGNANCY     Status: Abnormal   Collection Time   12/29/11  9:38 AM      Component Value Range   hCG, Beta Chain, Quant, S 13 (*) <5 mIU/mL   MDM 0930  Consult with Dr. Gaynell Face re: plan of care. Was given Toradol 60 mg IM and Tylenol 1000 mg for pain.  Client states headache and back ache have improved but are not totally resolved.  Repeat BP - 153/86.    Assessment and Plan  Falling quants - indicative of Missed AB. Headache Backache Elevated BP  Plan Be seen in the office next week for further evaluation.  You may be starting a period as your pregnancy hormone levels are very low.  Your blood pressure needs to be reevaluated. Return if you have severe bleeding which is worse than a period and you are feeling faint.   Your hemoglobin level is normal today.  You may be dizzy as your blood pressure has been elevated. Get your prescription filled and take as directed.  BURLESON,TERRI 12/29/2011, 9:34 AM

## 2012-06-07 ENCOUNTER — Encounter (HOSPITAL_COMMUNITY): Payer: Self-pay | Admitting: Physical Medicine and Rehabilitation

## 2012-06-07 ENCOUNTER — Emergency Department (HOSPITAL_COMMUNITY)
Admission: EM | Admit: 2012-06-07 | Discharge: 2012-06-07 | Disposition: A | Discharge status: Home / Self Care | Payer: Medicaid Other | Attending: Emergency Medicine | Admitting: Emergency Medicine

## 2012-06-07 DIAGNOSIS — N390 Urinary tract infection, site not specified: Secondary | ICD-10-CM | POA: Insufficient documentation

## 2012-06-07 DIAGNOSIS — Z9089 Acquired absence of other organs: Secondary | ICD-10-CM | POA: Insufficient documentation

## 2012-06-07 DIAGNOSIS — Z3202 Encounter for pregnancy test, result negative: Secondary | ICD-10-CM | POA: Insufficient documentation

## 2012-06-07 DIAGNOSIS — B9689 Other specified bacterial agents as the cause of diseases classified elsewhere: Secondary | ICD-10-CM

## 2012-06-07 DIAGNOSIS — I10 Essential (primary) hypertension: Secondary | ICD-10-CM | POA: Insufficient documentation

## 2012-06-07 DIAGNOSIS — N760 Acute vaginitis: Secondary | ICD-10-CM | POA: Insufficient documentation

## 2012-06-07 DIAGNOSIS — R110 Nausea: Secondary | ICD-10-CM | POA: Insufficient documentation

## 2012-06-07 HISTORY — DX: Essential (primary) hypertension: I10

## 2012-06-07 LAB — CBC WITH DIFFERENTIAL/PLATELET
Basophils Absolute: 0 10*3/uL (ref 0.0–0.1)
Eosinophils Relative: 1 % (ref 0–5)
Lymphocytes Relative: 36 % (ref 12–46)
Lymphs Abs: 1.6 10*3/uL (ref 0.7–4.0)
MCV: 62.9 fL — ABNORMAL LOW (ref 78.0–100.0)
Monocytes Relative: 6 % (ref 3–12)
Neutrophils Relative %: 57 % (ref 43–77)
Platelets: 140 10*3/uL — ABNORMAL LOW (ref 150–400)
RBC: 5.87 MIL/uL — ABNORMAL HIGH (ref 3.87–5.11)
RDW: 15.3 % (ref 11.5–15.5)
WBC: 4.5 10*3/uL (ref 4.0–10.5)

## 2012-06-07 LAB — URINE MICROSCOPIC-ADD ON

## 2012-06-07 LAB — URINALYSIS, ROUTINE W REFLEX MICROSCOPIC
Bilirubin Urine: NEGATIVE
Nitrite: NEGATIVE
Specific Gravity, Urine: 1.011 (ref 1.005–1.030)
Urobilinogen, UA: 0.2 mg/dL (ref 0.0–1.0)
pH: 7.5 (ref 5.0–8.0)

## 2012-06-07 LAB — COMPREHENSIVE METABOLIC PANEL
ALT: 12 U/L (ref 0–35)
Albumin: 3.8 g/dL (ref 3.5–5.2)
BUN: 7 mg/dL (ref 6–23)
Calcium: 9.3 mg/dL (ref 8.4–10.5)
GFR calc Af Amer: >90 mL/min (ref >90)
Glucose, Bld: 110 mg/dL — ABNORMAL HIGH (ref 70–99)
Potassium: 3.6 mEq/L (ref 3.5–5.1)
Sodium: 139 mEq/L (ref 135–145)
Total Protein: 6.9 g/dL (ref 6.0–8.3)

## 2012-06-07 LAB — LIPASE, BLOOD: Lipase: 32 U/L (ref 11–59)

## 2012-06-07 LAB — WET PREP, GENITAL: Yeast Wet Prep HPF POC: NONE SEEN

## 2012-06-07 LAB — PREGNANCY, URINE: Preg Test, Ur: NEGATIVE

## 2012-06-07 MED ORDER — HYDROMORPHONE HCL PF 1 MG/ML IJ SOLN
1.0000 mg | Freq: Once | INTRAMUSCULAR | Status: AC
  Administered 2012-06-07: 1 mg via INTRAVENOUS
  Filled 2012-06-07: qty 1

## 2012-06-07 MED ORDER — METRONIDAZOLE 500 MG PO TABS: 500.0000 mg | ORAL_TABLET | Freq: Two times a day (BID) | ORAL | Status: DC

## 2012-06-07 MED ORDER — ONDANSETRON HCL 4 MG/2ML IJ SOLN
4.0000 mg | Freq: Once | INTRAMUSCULAR | Status: AC
  Administered 2012-06-07: 4 mg via INTRAVENOUS
  Filled 2012-06-07: qty 2

## 2012-06-07 MED ORDER — SODIUM CHLORIDE 0.9 % IV SOLN
Freq: Once | INTRAVENOUS | Status: AC
  Administered 2012-06-07: 08:00:00 via INTRAVENOUS

## 2012-06-07 MED ORDER — SULFAMETHOXAZOLE-TRIMETHOPRIM 800-160 MG PO TABS: 1.0000 | ORAL_TABLET | Freq: Two times a day (BID) | ORAL | Status: DC

## 2012-06-07 NOTE — ED Notes (Signed)
Pt undressed, in gown, family at bedside

## 2012-06-07 NOTE — ED Provider Notes (Signed)
Medical screening examination/treatment/procedure(s) were performed by non-physician practitioner and as supervising physician I was immediately available for consultation/collaboration.    Nelia Shi, MD 06/07/12 [phone]

## 2012-06-07 NOTE — ED Notes (Signed)
Pt states some relief from abdominal pain. 5/10 at the present. No nausea/vomiting. Family at bedside. Pt resting quietly. No signs of acute distress noted.

## 2012-06-07 NOTE — ED Provider Notes (Signed)
History     CSN: [id number]  Arrival date & time 06/07/12  0714   First MD Initiated Contact with Patient 06/07/12 0732      Chief Complaint  Patient presents with  . Abdominal Pain  . Nausea    (Consider location/radiation/quality/duration/timing/severity/associated sxs/prior treatment) Patient is a 37 y.o. female presenting with abdominal pain. The history is provided by the patient. No language interpreter was used.  Abdominal Pain The primary symptoms of the illness include abdominal pain. The current episode started 6 to 12 hours ago. The onset of the illness was gradual. The problem has been gradually worsening.  The patient states that she believes she is currently not pregnant. The patient has not had a change in bowel habit. Risk factors: none. Symptoms associated with the illness do not include back pain. Significant associated medical issues do not include PUD.    Past Medical History  Diagnosis Date  . History of appendectomy 2001  . Hypertension     Past Surgical History  Procedure Date  . Appendectomy 2001  . Appendectomy     Family History  Problem Relation Age of Onset  . Diabetes Father   . Hypertension Father   . Other Neg Hx     History  Substance Use Topics  . Smoking status: Never Smoker   . Smokeless tobacco: Never Used  . Alcohol Use: No    OB History    Grav Para Term Preterm Abortions TAB SAB Ect Mult Living   1 0 0 0 0 0 0 0 0 0       Review of Systems  Gastrointestinal: Positive for abdominal pain.  Musculoskeletal: Negative for back pain.  All other systems reviewed and are negative.    Allergies  Review of patient's allergies indicates no known allergies.  Home Medications   Current Outpatient Rx  Name  Route  Sig  Dispense  Refill  . PRENATAL MULTIVITAMIN CH   Oral   Take 1 tablet by mouth at bedtime.           BP 168/106  Pulse 71  Temp 97.7 F (36.5 C) (Oral)  Resp 16  SpO2 100%  Breastfeeding?  Unknown  Physical Exam  Vitals reviewed. Constitutional: She is oriented to person, place, and time. She appears well-developed and well-nourished.  HENT:  Head: Normocephalic.  Right Ear: External ear normal.  Left Ear: External ear normal.  Eyes: Conjunctivae normal are normal. Pupils are equal, round, and reactive to light.  Neck: Normal range of motion.  Cardiovascular: Normal rate and normal heart sounds.   Pulmonary/Chest: Effort normal and breath sounds normal.  Abdominal: Soft. There is tenderness.  Neurological: She is alert and oriented to person, place, and time. She has normal reflexes.  Skin: Skin is warm.  Psychiatric: She has a normal mood and affect.    ED Course  Procedures (including critical care time)  Labs Reviewed - No data to display No results found.   No diagnosis found.    MDM   Results for orders placed during the hospital encounter of 06/07/12  URINALYSIS, ROUTINE W REFLEX MICROSCOPIC      Component Value Range   Color, Urine YELLOW  YELLOW   APPearance CLOUDY (*) CLEAR   Specific Gravity, Urine 1.011  1.005 - 1.030   pH 7.5  5.0 - 8.0   Glucose, UA NEGATIVE  NEGATIVE mg/dL   Hgb urine dipstick SMALL (*) NEGATIVE   Bilirubin Urine NEGATIVE  NEGATIVE   Ketones,  ur NEGATIVE  NEGATIVE mg/dL   Protein, ur NEGATIVE  NEGATIVE mg/dL   Urobilinogen, UA 0.2  0.0 - 1.0 mg/dL   Nitrite NEGATIVE  NEGATIVE   Leukocytes, UA MODERATE (*) NEGATIVE  PREGNANCY, URINE      Component Value Range   Preg Test, Ur NEGATIVE  NEGATIVE  CBC WITH DIFFERENTIAL      Component Value Range   WBC 4.5  4.0 - 10.5 K/uL   RBC 5.87 (*) 3.87 - 5.11 MIL/uL   Hemoglobin 12.4  12.0 - 15.0 g/dL   HCT 16.1  09.6 - 04.5 %   MCV 62.9 (*) 78.0 - 100.0 fL   MCH 21.1 (*) 26.0 - 34.0 pg   MCHC 33.6  30.0 - 36.0 g/dL   RDW 40.9  81.1 - 91.4 %   Platelets 140 (*) 150 - 400 K/uL   Neutrophils Relative 57  43 - 77 %   Lymphocytes Relative 36  12 - 46 %   Monocytes Relative 6  3  - 12 %   Eosinophils Relative 1  0 - 5 %   Basophils Relative 0  0 - 1 %   Neutro Abs 2.6  1.7 - 7.7 K/uL   Lymphs Abs 1.6  0.7 - 4.0 K/uL   Monocytes Absolute 0.3  0.1 - 1.0 K/uL   Eosinophils Absolute 0.0  0.0 - 0.7 K/uL   Basophils Absolute 0.0  0.0 - 0.1 K/uL   RBC Morphology ELLIPTOCYTES     WBC Morphology ATYPICAL LYMPHOCYTES     Smear Review LARGE PLATELETS PRESENT    COMPREHENSIVE METABOLIC PANEL      Component Value Range   Sodium 139  135 - 145 mEq/L   Potassium 3.6  3.5 - 5.1 mEq/L   Chloride 105  96 - 112 mEq/L   CO2 24  19 - 32 mEq/L   Glucose, Bld 110 (*) 70 - 99 mg/dL   BUN 7  6 - 23 mg/dL   Creatinine, Ser 7.82  0.50 - 1.10 mg/dL   Calcium 9.3  8.4 - 95.6 mg/dL   Total Protein 6.9  6.0 - 8.3 g/dL   Albumin 3.8  3.5 - 5.2 g/dL   AST 15  0 - 37 U/L   ALT 12  0 - 35 U/L   Alkaline Phosphatase 49  39 - 117 U/L   Total Bilirubin 0.4  0.3 - 1.2 mg/dL   GFR calc non Af Amer >90  >90 mL/min   GFR calc Af Amer >90  >90 mL/min  LIPASE, BLOOD      Component Value Range   Lipase 32  11 - 59 U/L  URINE MICROSCOPIC-ADD ON      Component Value Range   Squamous Epithelial / LPF MANY (*) RARE   WBC, UA 11-20  <3 WBC/hpf   RBC / HPF 0-2  <3 RBC/hpf   Bacteria, UA FEW (*) RARE  WET PREP, GENITAL      Component Value Range   Yeast Wet Prep HPF POC NONE SEEN  NONE SEEN   Trich, Wet Prep NONE SEEN  NONE SEEN   Clue Cells Wet Prep HPF POC MODERATE (*) NONE SEEN   WBC, Wet Prep HPF POC FEW (*) NONE SEEN   No results found.   Pt given rx for flagyl and bactrim      Lonia Skinner [place], [place] 06/07/12 1205

## 2012-06-07 NOTE — ED Notes (Signed)
Pt presents to department for evaluation of diffuse abdominal pain and nausea. Sudden onset this morning @ 5:30am. 9/10 pain upon arrival. Denies urinary symptoms. Denies vaginal symptoms. Last BM this morning was normal. She is alert and oriented x4.

## 2012-06-07 NOTE — ED Notes (Signed)
Chaperoned and assisted Jamie Mcbride, [place] with a pelvic examination and the collection of vaginal specimens; husband was present and at bedside

## 2012-06-07 NOTE — ED Notes (Signed)
Pt up ambulatory to the bathroom without any difficulty or distress; pt also attempting to provide an urine sample

## 2012-06-08 LAB — URINE CULTURE

## 2012-06-08 LAB — GC/CHLAMYDIA PROBE AMP: CT Probe RNA: NEGATIVE

## 2013-01-14 ENCOUNTER — Ambulatory Visit: Payer: Self-pay | Admitting: Obstetrics

## 2013-01-27 ENCOUNTER — Ambulatory Visit: Payer: Self-pay | Admitting: Obstetrics

## 2013-02-11 ENCOUNTER — Ambulatory Visit: Payer: Self-pay | Admitting: Obstetrics

## 2013-07-13 ENCOUNTER — Encounter: Payer: Self-pay | Admitting: Obstetrics

## 2013-07-13 ENCOUNTER — Ambulatory Visit (INDEPENDENT_AMBULATORY_CARE_PROVIDER_SITE_OTHER): Payer: BC Managed Care – PPO | Admitting: Obstetrics

## 2013-07-13 VITALS — BP 170/107 | HR 84 | Temp 98.7°F | Wt 224.0 lb

## 2013-07-13 DIAGNOSIS — Z01419 Encounter for gynecological examination (general) (routine) without abnormal findings: Secondary | ICD-10-CM

## 2013-07-13 DIAGNOSIS — N839 Noninflammatory disorder of ovary, fallopian tube and broad ligament, unspecified: Secondary | ICD-10-CM

## 2013-07-13 NOTE — Progress Notes (Signed)
Subjective:     Jamie Mcbride is a 38 y.o. female here for a routine exam.  Current complaints: Patient is in office today for a follow up visit following a miscarriage in 2013.  Personal health questionnaire reviewed: yes.  Requests pap smear.   Gynecologic History Patient's last menstrual period was 06/20/2013. Contraception: none Last Pap: Unknown. Results were: n/a  Obstetric History OB History  Gravida Para Term Preterm AB SAB TAB Ectopic Multiple Living  1 0 0 0 1 1 0 0 0 0     # Outcome Date GA Lbr Len/2nd Weight Sex Delivery Anes PTL Lv  1 SAB 2013             Comments: System Generated. Please review and update pregnancy details.       The following portions of the patient's history were reviewed and updated as appropriate: allergies, current medications, past family history, past medical history, past social history, past surgical history and problem list.  Review of Systems Pertinent items are noted in HPI.    Objective:    General appearance: alert and no distress Breasts: normal appearance, no masses or tenderness Abdomen: normal findings: soft, non-tender Pelvic: cervix normal in appearance, external genitalia normal, no adnexal masses or tenderness, no cervical motion tenderness, rectovaginal septum normal, uterus normal size, shape, and consistency and vagina normal without discharge    Assessment:    Healthy female exam.    Plan:    Education reviewed: self breast exams, weight bearing exercise and preconception counseling. Contraception: none and trying to get pregnant for 2 years.. Follow up in: several months. Referred to Reproductive Endocrinology for possible ovulatory dysfunction. Folic acid Rx.

## 2013-07-13 NOTE — Addendum Note (Signed)
Addended by: Ladona Ridgel on: 07/13/2013 05:16 PM   Modules accepted: Orders

## 2013-07-13 NOTE — Addendum Note (Signed)
Addended by: Lewie Loron D on: 07/13/2013 05:19 PM   Modules accepted: Orders

## 2013-07-14 LAB — WET PREP BY MOLECULAR PROBE
CANDIDA SPECIES: POSITIVE — AB
GARDNERELLA VAGINALIS: POSITIVE — AB
TRICHOMONAS VAG: NEGATIVE

## 2013-07-14 LAB — PAP IG W/ RFLX HPV ASCU

## 2013-07-14 LAB — GC/CHLAMYDIA PROBE AMP
CT PROBE, AMP APTIMA: NEGATIVE
GC Probe RNA: NEGATIVE

## 2013-07-17 ENCOUNTER — Emergency Department (HOSPITAL_BASED_OUTPATIENT_CLINIC_OR_DEPARTMENT_OTHER): Payer: BC Managed Care – PPO

## 2013-07-17 ENCOUNTER — Emergency Department (HOSPITAL_BASED_OUTPATIENT_CLINIC_OR_DEPARTMENT_OTHER)
Admission: EM | Admit: 2013-07-17 | Discharge: 2013-07-17 | Disposition: A | Discharge status: Home / Self Care | Payer: BC Managed Care – PPO | Attending: Emergency Medicine | Admitting: Emergency Medicine

## 2013-07-17 ENCOUNTER — Encounter (HOSPITAL_BASED_OUTPATIENT_CLINIC_OR_DEPARTMENT_OTHER): Payer: Self-pay | Admitting: Emergency Medicine

## 2013-07-17 DIAGNOSIS — Y939 Activity, unspecified: Secondary | ICD-10-CM | POA: Insufficient documentation

## 2013-07-17 DIAGNOSIS — W1809XA Striking against other object with subsequent fall, initial encounter: Secondary | ICD-10-CM | POA: Insufficient documentation

## 2013-07-17 DIAGNOSIS — I10 Essential (primary) hypertension: Secondary | ICD-10-CM | POA: Insufficient documentation

## 2013-07-17 DIAGNOSIS — Y9289 Other specified places as the place of occurrence of the external cause: Secondary | ICD-10-CM | POA: Insufficient documentation

## 2013-07-17 DIAGNOSIS — Z79899 Other long term (current) drug therapy: Secondary | ICD-10-CM | POA: Insufficient documentation

## 2013-07-17 DIAGNOSIS — S060X9A Concussion with loss of consciousness of unspecified duration, initial encounter: Secondary | ICD-10-CM | POA: Insufficient documentation

## 2013-07-17 DIAGNOSIS — Y990 Civilian activity done for income or pay: Secondary | ICD-10-CM | POA: Insufficient documentation

## 2013-07-17 DIAGNOSIS — F29 Unspecified psychosis not due to a substance or known physiological condition: Secondary | ICD-10-CM | POA: Insufficient documentation

## 2013-07-17 DIAGNOSIS — W010XXA Fall on same level from slipping, tripping and stumbling without subsequent striking against object, initial encounter: Secondary | ICD-10-CM | POA: Insufficient documentation

## 2013-07-17 NOTE — ED Provider Notes (Signed)
CSN: [id number]     Arrival date & time 07/17/13  2751 History   First MD Initiated Contact with Patient 07/17/13 [phone]     No chief complaint on file.    (Consider location/radiation/quality/duration/timing/severity/associated sxs/prior Treatment) HPI Pt reports this morning she slipped on some ice, falling backwards and striking her head on the ground. She had brief LOC and was then confused, repeatedly asking her husband what happened. She is complaining now of mild diffuse headache, but back to baseline otherwise. Denies any other injuries. No neck or back pain.    Past Medical History  Diagnosis Date  . History of appendectomy 2001  . Hypertension    Past Surgical History  Procedure Laterality Date  . Appendectomy  2001  . Appendectomy     Family History  Problem Relation Age of Onset  . Diabetes Father   . Hypertension Father   . Other Neg Hx    History  Substance Use Topics  . Smoking status: Never Smoker   . Smokeless tobacco: Never Used  . Alcohol Use: No   OB History   Grav Para Term Preterm Abortions TAB SAB Ect Mult Living   1 0 0 0 1 0 1 0 0 0      Review of Systems All other systems reviewed and are negative except as noted in HPI.     Allergies  Review of patient's allergies indicates no known allergies.  Home Medications   Current Outpatient Rx  Name  Route  Sig  Dispense  Refill  . metroNIDAZOLE (FLAGYL) 500 MG tablet   Oral   Take 1 tablet (500 mg total) by mouth 2 (two) times daily.   14 tablet   0   . Prenatal Vit-Fe Fumarate-FA (PRENATAL MULTIVITAMIN) TABS   Oral   Take 1 tablet by mouth at bedtime.         . sulfamethoxazole-trimethoprim (SEPTRA DS) 800-160 MG per tablet   Oral   Take 1 tablet by mouth every 12 (twelve) hours.   14 tablet   0    LMP 06/20/2013 Physical Exam  Nursing note and vitals reviewed. Constitutional: She is oriented to person, place, and time. She appears well-developed and well-nourished.  HENT:   Head: Normocephalic and atraumatic.  Eyes: EOM are normal. Pupils are equal, round, and reactive to light.  Neck: Normal range of motion. Neck supple.  No midline spine tenderness  Cardiovascular: Normal rate, normal heart sounds and intact distal pulses.   Pulmonary/Chest: Effort normal and breath sounds normal.  Abdominal: Bowel sounds are normal. She exhibits no distension. There is no tenderness.  Musculoskeletal: Normal range of motion. She exhibits no edema and no tenderness.  Neurological: She is alert and oriented to person, place, and time. She has normal strength. No cranial nerve deficit or sensory deficit.  Skin: Skin is warm and dry. No rash noted.  Psychiatric: She has a normal mood and affect.    ED Course  Procedures (including critical care time) Labs Review Labs Reviewed - No data to display Imaging Review Ct Head Wo Contrast  07/17/2013   CLINICAL DATA:  38 year old female status post slip and fall with headache and loss of consciousness. Initial encounter.  EXAM: CT HEAD WITHOUT CONTRAST  TECHNIQUE: Contiguous axial images were obtained from the base of the skull through the vertex without intravenous contrast.  COMPARISON:  None.  FINDINGS: Visualized orbit soft tissues are within normal limits. There does appear to be some broad-based scalp hematoma at the  vertex. Alternatively this could be chronic scarring. Underlying calvarium intact. No acute osseous abnormality identified. Visualized paranasal sinuses and mastoids are clear.  Cerebral volume is normal. No midline shift, ventriculomegaly, mass effect, evidence of mass lesion, intracranial hemorrhage or evidence of cortically based acute infarction. Gray-white matter differentiation is within normal limits throughout the brain. No suspicious intracranial vascular hyperdensity.  IMPRESSION: 1.  Normal noncontrast CT appearance of the brain. 2. Possible scalp hematoma at the vertex, but no skull fracture identified.    Electronically Signed   By: Lars Pinks M.D.   On: 07/17/2013 08:05    EKG Interpretation  None  MDM   Final diagnoses:  Concussion with loss of consciousness    CT neg for ICH or fracture. Pt with likely concussion, improved. At baseline now. Given expected symptoms and duration. Advised to return for any other concerns.     Charles B. Karle Starch, MD 07/17/13 [phone]

## 2013-07-17 NOTE — Discharge Instructions (Signed)

## 2013-07-17 NOTE — ED Notes (Signed)
Slipped on ice while at work, struck head on floor and had a LOC.

## 2013-07-21 ENCOUNTER — Other Ambulatory Visit: Payer: Self-pay | Admitting: *Deleted

## 2013-07-21 DIAGNOSIS — B373 Candidiasis of vulva and vagina: Secondary | ICD-10-CM

## 2013-07-21 DIAGNOSIS — B9689 Other specified bacterial agents as the cause of diseases classified elsewhere: Secondary | ICD-10-CM

## 2013-07-21 DIAGNOSIS — N760 Acute vaginitis: Principal | ICD-10-CM

## 2013-07-21 DIAGNOSIS — B3731 Acute candidiasis of vulva and vagina: Secondary | ICD-10-CM

## 2013-07-21 MED ORDER — FLUCONAZOLE 150 MG PO TABS: 150.0000 mg | ORAL_TABLET | Freq: Every day | ORAL | Status: DC

## 2013-07-21 MED ORDER — METRONIDAZOLE 500 MG PO TABS: 500.0000 mg | ORAL_TABLET | Freq: Two times a day (BID) | ORAL | Status: DC

## 2013-07-22 ENCOUNTER — Other Ambulatory Visit: Payer: Self-pay | Admitting: *Deleted

## 2013-07-22 DIAGNOSIS — B3731 Acute candidiasis of vulva and vagina: Secondary | ICD-10-CM

## 2013-07-22 DIAGNOSIS — B373 Candidiasis of vulva and vagina: Secondary | ICD-10-CM

## 2013-07-22 DIAGNOSIS — N760 Acute vaginitis: Secondary | ICD-10-CM

## 2013-07-22 DIAGNOSIS — B9689 Other specified bacterial agents as the cause of diseases classified elsewhere: Secondary | ICD-10-CM

## 2013-07-22 MED ORDER — METRONIDAZOLE 500 MG PO TABS: 500.0000 mg | ORAL_TABLET | Freq: Two times a day (BID) | ORAL | Status: DC

## 2014-03-22 ENCOUNTER — Encounter (HOSPITAL_BASED_OUTPATIENT_CLINIC_OR_DEPARTMENT_OTHER): Payer: Self-pay | Admitting: Emergency Medicine

## 2015-03-17 ENCOUNTER — Other Ambulatory Visit: Payer: BLUE CROSS/BLUE SHIELD

## 2015-04-01 ENCOUNTER — Encounter: Payer: Self-pay | Admitting: Certified Nurse Midwife

## 2015-04-01 ENCOUNTER — Ambulatory Visit (INDEPENDENT_AMBULATORY_CARE_PROVIDER_SITE_OTHER): Payer: BLUE CROSS/BLUE SHIELD | Admitting: Certified Nurse Midwife

## 2015-04-01 VITALS — BP 142/98 | HR 65 | Temp 98.8°F | Wt 210.0 lb

## 2015-04-01 DIAGNOSIS — O09519 Supervision of elderly primigravida, unspecified trimester: Secondary | ICD-10-CM | POA: Diagnosis not present

## 2015-04-01 DIAGNOSIS — O161 Unspecified maternal hypertension, first trimester: Secondary | ICD-10-CM

## 2015-04-01 DIAGNOSIS — O131 Gestational [pregnancy-induced] hypertension without significant proteinuria, first trimester: Secondary | ICD-10-CM

## 2015-04-01 DIAGNOSIS — N760 Acute vaginitis: Secondary | ICD-10-CM

## 2015-04-01 DIAGNOSIS — Z331 Pregnant state, incidental: Secondary | ICD-10-CM | POA: Diagnosis not present

## 2015-04-01 DIAGNOSIS — O09511 Supervision of elderly primigravida, first trimester: Secondary | ICD-10-CM

## 2015-04-01 DIAGNOSIS — Z1389 Encounter for screening for other disorder: Secondary | ICD-10-CM | POA: Diagnosis not present

## 2015-04-01 DIAGNOSIS — Z3401 Encounter for supervision of normal first pregnancy, first trimester: Secondary | ICD-10-CM | POA: Diagnosis not present

## 2015-04-01 LAB — POCT URINALYSIS DIPSTICK
BILIRUBIN UA: NEGATIVE
GLUCOSE UA: NEGATIVE
KETONES UA: NEGATIVE
Nitrite, UA: NEGATIVE
Protein, UA: NEGATIVE
RBC UA: NEGATIVE
SPEC GRAV UA: 1.015
Urobilinogen, UA: NEGATIVE
pH, UA: 6

## 2015-04-01 MED ORDER — TERCONAZOLE 0.4 % VA CREA: 1.0000 | TOPICAL_CREAM | Freq: Every day | VAGINAL | Status: DC

## 2015-04-01 MED ORDER — LABETALOL HCL 200 MG PO TABS: 200.0000 mg | ORAL_TABLET | Freq: Two times a day (BID) | ORAL | Status: DC

## 2015-04-01 NOTE — Progress Notes (Signed)
Subjective:    Jamie Mcbride is being seen today for her first obstetrical visit.  This is a planned pregnancy. She is at [redacted]w[redacted]d gestation. Her obstetrical history is significant for advanced maternal age and HTN. Relationship with FOB: spouse, living together. Patient does intend to breast feed. Pregnancy history fully reviewed.  The information documented in the HPI was reviewed and verified.  Menstrual History: OB History    Gravida Para Term Preterm AB TAB SAB Ectopic Multiple Living   2 0 0 0 1 0 1 0 0 0       Menarche age: 39 years of age, roughly  Patient's last menstrual period was 02/04/2015.    Past Medical History  Diagnosis Date  . History of appendectomy 2001  . Hypertension     Past Surgical History  Procedure Laterality Date  . Appendectomy  2001  . Appendectomy       (Not in a hospital admission) No Known Allergies  Social History  Substance Use Topics  . Smoking status: Never Smoker   . Smokeless tobacco: Never Used  . Alcohol Use: No    Family History  Problem Relation Age of Onset  . Diabetes Father   . Hypertension Father   . Other Neg Hx      Review of Systems Constitutional: negative for weight loss Gastrointestinal: negative for vomiting Genitourinary:negative for genital lesions and vaginal discharge and dysuria Musculoskeletal:negative for back pain Behavioral/Psych: negative for abusive relationship, depression, illegal drug usage and tobacco use    Objective:    BP 142/98 mmHg  Pulse 65  Temp(Src) 98.8 F (37.1 C)  Wt 210 lb (95.255 kg)  LMP 02/04/2015 General Appearance:    Alert, cooperative, no distress, appears stated age  Head:    Normocephalic, without obvious abnormality, atraumatic  Eyes:    PERRL, conjunctiva/corneas clear, EOM's intact, fundi    benign, both eyes  Ears:    Normal TM's and external ear canals, both ears  Nose:   Nares normal, septum midline, mucosa normal, no drainage    or sinus tenderness  Throat:    Lips, mucosa, and tongue normal; teeth and gums normal  Neck:   Supple, symmetrical, trachea midline, no adenopathy;    thyroid:  no enlargement/tenderness/nodules; no carotid   bruit or JVD  Back:     Symmetric, no curvature, ROM normal, no CVA tenderness  Lungs:     Clear to auscultation bilaterally, respirations unlabored  Chest Wall:    No tenderness or deformity   Heart:    Regular rate and rhythm, S1 and S2 normal, no murmur, rub   or gallop  Breast Exam:    No tenderness, masses, or nipple abnormality  Abdomen:     Soft, non-tender, bowel sounds active all four quadrants,    no masses, no organomegaly  Genitalia:    Normal female without lesion, discharge or tenderness  Extremities:   Extremities normal, atraumatic, no cyanosis or edema  Pulses:   2+ and symmetric all extremities  Skin:   Skin color, texture, turgor normal, no rashes or lesions  Lymph nodes:   Cervical, supraclavicular, and axillary nodes normal  Neurologic:   CNII-XII intact, normal strength, sensation and reflexes    throughout                                 Cervix: long, thick, closed.      Lab Review Urine pregnancy test  Labs reviewed yes Radiologic studies reviewed no Assessment:    Pregnancy at [redacted]w[redacted]d weeks   AMA  HTN  Plan:    MFM consult and MFM ultrasound  Prenatal vitamins.  Counseling provided regarding continued use of seat belts, cessation of alcohol consumption, smoking or use of illicit drugs; infection precautions i.e., influenza/TDAP immunizations, toxoplasmosis,CMV, parvovirus, listeria and varicella; workplace safety, exercise during pregnancy; routine dental care, safe medications, sexual activity, hot tubs, saunas, pools, travel, caffeine use, fish and methlymercury, potential toxins, hair treatments, varicose veins Weight gain recommendations per IOM guidelines reviewed: underweight/BMI< 18.5--> gain 28 - 40 lbs; normal weight/BMI 18.5 - 24.9--> gain 25 - 35 lbs; overweight/BMI 25 -  29.9--> gain 15 - 25 lbs; obese/BMI >30->gain  11 - 20 lbs Problem list reviewed and updated. FIRST/CF mutation testing/NIPT/QUAD SCREEN/fragile X/Ashkenazi Jewish population testing/Spinal muscular atrophy discussed: requested. Role of ultrasound in pregnancy discussed; fetal survey: requested. Amniocentesis discussed: not indicated. VBAC calculator score: VBAC consent form provided No orders of the defined types were placed in this encounter.   Orders Placed This Encounter  Procedures  . Culture, OB Urine  . SureSwab, Vaginosis/Vaginitis Plus  . Obstetric panel  . HIV antibody  . Hemoglobinopathy evaluation  . Varicella zoster antibody, IgG  . TSH  . Vit D  25 hydroxy (rtn osteoporosis monitoring)  . POCT urinalysis dipstick    Follow up in 4 weeks. 50% of 30 min visit spent on counseling and coordination of care.

## 2015-04-02 LAB — CULTURE, OB URINE
Colony Count: NO GROWTH
ORGANISM ID, BACTERIA: NO GROWTH

## 2015-04-02 LAB — HIV ANTIBODY (ROUTINE TESTING W REFLEX): HIV: NONREACTIVE

## 2015-04-02 LAB — VITAMIN D 25 HYDROXY (VIT D DEFICIENCY, FRACTURES): Vit D, 25-Hydroxy: 24 ng/mL — ABNORMAL LOW (ref 30–100)

## 2015-04-02 LAB — TSH: TSH: 3.053 u[IU]/mL (ref 0.350–4.500)

## 2015-04-04 LAB — OBSTETRIC PANEL
Antibody Screen: NEGATIVE
BASOS PCT: 0 % (ref 0–1)
Basophils Absolute: 0 10*3/uL (ref 0.0–0.1)
Eosinophils Absolute: 0 10*3/uL (ref 0.0–0.7)
Eosinophils Relative: 1 % (ref 0–5)
HEMATOCRIT: 40 % (ref 36.0–46.0)
HEP B S AG: NEGATIVE
Hemoglobin: 12.1 g/dL (ref 12.0–15.0)
LYMPHS ABS: 1.5 10*3/uL (ref 0.7–4.0)
Lymphocytes Relative: 35 % (ref 12–46)
MCH: 20.6 pg — ABNORMAL LOW (ref 26.0–34.0)
MCHC: 30.3 g/dL (ref 30.0–36.0)
MCV: 68 fL — ABNORMAL LOW (ref 78.0–100.0)
MONO ABS: 0.3 10*3/uL (ref 0.1–1.0)
MONOS PCT: 8 % (ref 3–12)
NEUTROS ABS: 2.4 10*3/uL (ref 1.7–7.7)
Neutrophils Relative %: 56 % (ref 43–77)
PLATELETS: 153 10*3/uL (ref 150–400)
RBC: 5.88 MIL/uL — ABNORMAL HIGH (ref 3.87–5.11)
RDW: 17.2 % — AB (ref 11.5–15.5)
RH TYPE: POSITIVE
RUBELLA: 19.5 {index} — AB (ref <0.90)
WBC: 4.3 10*3/uL (ref 4.0–10.5)

## 2015-04-04 LAB — VARICELLA ZOSTER ANTIBODY, IGG: Varicella IgG: 184.9 Index — ABNORMAL HIGH (ref <135.00)

## 2015-04-05 LAB — SURESWAB, VAGINOSIS/VAGINITIS PLUS
ATOPOBIUM VAGINAE: 7.1 Log (cells/mL)
C. ALBICANS, DNA: DETECTED — AB
C. TRACHOMATIS RNA, TMA: NOT DETECTED
C. glabrata, DNA: NOT DETECTED
C. parapsilosis, DNA: NOT DETECTED
C. tropicalis, DNA: NOT DETECTED
LACTOBACILLUS SPECIES: NOT DETECTED Log (cells/mL)
MEGASPHAERA SPECIES: NOT DETECTED Log (cells/mL)
N. gonorrhoeae RNA, TMA: NOT DETECTED
T. VAGINALIS RNA, QL TMA: NOT DETECTED

## 2015-04-05 LAB — HEMOGLOBINOPATHY EVALUATION
HGB A2 QUANT: 3.9 % — AB (ref 2.2–3.2)
HGB A: 68.3 % — AB (ref 96.8–97.8)
Hemoglobin Other: 0 %
Hgb F Quant: 0 % (ref 0.0–2.0)
Hgb S Quant: 27.8 % — ABNORMAL HIGH

## 2015-04-05 LAB — PAP, TP IMAGING W/ HPV RNA, RFLX HPV TYPE 16,18/45: HPV MRNA, HIGH RISK: NOT DETECTED

## 2015-04-06 ENCOUNTER — Other Ambulatory Visit: Payer: Self-pay | Admitting: Certified Nurse Midwife

## 2015-04-06 DIAGNOSIS — B9689 Other specified bacterial agents as the cause of diseases classified elsewhere: Secondary | ICD-10-CM

## 2015-04-06 DIAGNOSIS — N760 Acute vaginitis: Principal | ICD-10-CM

## 2015-04-06 DIAGNOSIS — B3731 Acute candidiasis of vulva and vagina: Secondary | ICD-10-CM

## 2015-04-06 DIAGNOSIS — B373 Candidiasis of vulva and vagina: Secondary | ICD-10-CM

## 2015-04-06 MED ORDER — TERCONAZOLE 0.4 % VA CREA: 1.0000 | TOPICAL_CREAM | Freq: Every day | VAGINAL | Status: DC

## 2015-04-06 MED ORDER — METRONIDAZOLE 0.75 % VA GEL: 1.0000 | Freq: Two times a day (BID) | VAGINAL | Status: DC

## 2015-04-12 ENCOUNTER — Other Ambulatory Visit (HOSPITAL_COMMUNITY): Payer: Self-pay | Admitting: Maternal and Fetal Medicine

## 2015-04-12 ENCOUNTER — Other Ambulatory Visit: Payer: Self-pay | Admitting: Certified Nurse Midwife

## 2015-04-12 ENCOUNTER — Encounter (HOSPITAL_COMMUNITY): Payer: Medicaid Other

## 2015-04-12 ENCOUNTER — Ambulatory Visit (HOSPITAL_COMMUNITY)
Admission: RE | Admit: 2015-04-12 | Discharge: 2015-04-12 | Disposition: A | Discharge status: Home / Self Care | Payer: BLUE CROSS/BLUE SHIELD | Source: Ambulatory Visit | Attending: Certified Nurse Midwife | Admitting: Certified Nurse Midwife

## 2015-04-12 ENCOUNTER — Ambulatory Visit (HOSPITAL_COMMUNITY): Admission: RE | Admit: 2015-04-12 | Payer: BLUE CROSS/BLUE SHIELD | Source: Ambulatory Visit

## 2015-04-12 DIAGNOSIS — Z3A10 10 weeks gestation of pregnancy: Secondary | ICD-10-CM

## 2015-04-12 DIAGNOSIS — O10011 Pre-existing essential hypertension complicating pregnancy, first trimester: Secondary | ICD-10-CM

## 2015-04-12 DIAGNOSIS — O09521 Supervision of elderly multigravida, first trimester: Secondary | ICD-10-CM | POA: Diagnosis present

## 2015-04-12 DIAGNOSIS — Z3A09 9 weeks gestation of pregnancy: Secondary | ICD-10-CM

## 2015-04-12 DIAGNOSIS — O3680X0 Pregnancy with inconclusive fetal viability, not applicable or unspecified: Secondary | ICD-10-CM

## 2015-04-12 DIAGNOSIS — O09519 Supervision of elderly primigravida, unspecified trimester: Secondary | ICD-10-CM

## 2015-04-12 DIAGNOSIS — O10019 Pre-existing essential hypertension complicating pregnancy, unspecified trimester: Secondary | ICD-10-CM | POA: Diagnosis not present

## 2015-04-12 DIAGNOSIS — Z36 Encounter for antenatal screening of mother: Secondary | ICD-10-CM | POA: Insufficient documentation

## 2015-04-13 ENCOUNTER — Other Ambulatory Visit: Payer: Self-pay | Admitting: Certified Nurse Midwife

## 2015-04-13 ENCOUNTER — Encounter (HOSPITAL_COMMUNITY): Payer: Self-pay

## 2015-04-13 ENCOUNTER — Inpatient Hospital Stay (HOSPITAL_COMMUNITY)
Admission: AD | Admit: 2015-04-13 | Discharge: 2015-04-13 | Disposition: A | Discharge status: Home / Self Care | Payer: BLUE CROSS/BLUE SHIELD | Source: Ambulatory Visit | Attending: Obstetrics | Admitting: Obstetrics

## 2015-04-13 DIAGNOSIS — O4691 Antepartum hemorrhage, unspecified, first trimester: Secondary | ICD-10-CM | POA: Diagnosis not present

## 2015-04-13 DIAGNOSIS — Z3A09 9 weeks gestation of pregnancy: Secondary | ICD-10-CM | POA: Insufficient documentation

## 2015-04-13 DIAGNOSIS — O209 Hemorrhage in early pregnancy, unspecified: Secondary | ICD-10-CM | POA: Diagnosis present

## 2015-04-13 LAB — CBC
HEMATOCRIT: 33.1 % — AB (ref 36.0–46.0)
HEMOGLOBIN: 11.3 g/dL — AB (ref 12.0–15.0)
MCH: 21.8 pg — ABNORMAL LOW (ref 26.0–34.0)
MCHC: 34.1 g/dL (ref 30.0–36.0)
MCV: 63.8 fL — AB (ref 78.0–100.0)
Platelets: 132 10*3/uL — ABNORMAL LOW (ref 150–400)
RBC: 5.19 MIL/uL — ABNORMAL HIGH (ref 3.87–5.11)
RDW: 15.7 % — AB (ref 11.5–15.5)
WBC: 5.1 10*3/uL (ref 4.0–10.5)

## 2015-04-13 LAB — URINALYSIS, ROUTINE W REFLEX MICROSCOPIC
BILIRUBIN URINE: NEGATIVE
Glucose, UA: NEGATIVE mg/dL
Ketones, ur: 40 mg/dL — AB
Leukocytes, UA: NEGATIVE
NITRITE: NEGATIVE
PH: 5.5 (ref 5.0–8.0)
Protein, ur: NEGATIVE mg/dL
SPECIFIC GRAVITY, URINE: 1.025 (ref 1.005–1.030)

## 2015-04-13 LAB — URINE MICROSCOPIC-ADD ON
BACTERIA UA: NONE SEEN
WBC, UA: NONE SEEN WBC/hpf (ref 0–5)

## 2015-04-13 LAB — HCG, QUANTITATIVE, PREGNANCY: hCG, Beta Chain, Quant, S: 13036 m[IU]/mL — ABNORMAL HIGH (ref <5)

## 2015-04-13 NOTE — Discharge Instructions (Signed)
Vaginal Bleeding During Pregnancy, First Trimester A small amount of bleeding (spotting) from the vagina is common in early pregnancy. Sometimes the bleeding is normal and is not a problem, and sometimes it is a sign of something serious. Be sure to tell your doctor about any bleeding from your vagina right away. HOME CARE  Watch your condition for any changes.  Follow your doctor's instructions about how active you can be.  If you are on bed rest:  You may need to stay in bed and only get up to use the bathroom.  You may be allowed to do some activities.  If you need help, make plans for someone to help you.  Write down:  The number of pads you use each day.  How often you change pads.  How soaked (saturated) your pads are.  Do not use tampons.  Do not douche.  Do not have sex or orgasms until your doctor says it is okay.  If you pass any tissue from your vagina, save the tissue so you can show it to your doctor.  Only take medicines as told by your doctor.  Do not take aspirin because it can make you bleed.  Keep all follow-up visits as told by your doctor. GET HELP IF:   You bleed from your vagina.  You have cramps.  You have labor pains.  You have a fever that does not go away after you take medicine. GET HELP RIGHT AWAY IF:   You have very bad cramps in your back or belly (abdomen).  You pass large clots or tissue from your vagina.  You bleed more.  You feel light-headed or weak.  You pass out (faint).  You have chills.  You are leaking fluid or have a gush of fluid from your vagina.  You pass out while pooping (having a bowel movement). MAKE SURE YOU:  Understand these instructions.  Will watch your condition.  Will get help right away if you are not doing well or get worse.   This information is not intended to replace advice given to you by your health care provider. Make sure you discuss any questions you have with your health care  provider.   Document Released: 09/21/2013 Document Reviewed: 09/21/2013 Elsevier Interactive Patient Education 2016 [employer].  Pelvic Rest Pelvic rest is sometimes recommended for women when:   The placenta is partially or completely covering the opening of the cervix (placenta previa).  There is bleeding between the uterine wall and the amniotic sac in the first trimester (subchorionic hemorrhage).  The cervix begins to open without labor starting (incompetent cervix, cervical insufficiency).  The labor is too early (preterm labor). HOME CARE INSTRUCTIONS  Do not have sexual intercourse, stimulation, or an orgasm.  Do not use tampons, douche, or put anything in the vagina.  Do not lift anything over 10 pounds (4.5 kg).  Avoid strenuous activity or straining your pelvic muscles. SEEK MEDICAL CARE IF:  You have any vaginal bleeding during pregnancy. Treat this as a potential emergency.  You have cramping pain felt low in the stomach (stronger than menstrual cramps).  You notice vaginal discharge (watery, mucus, or bloody).  You have a low, dull backache.  There are regular contractions or uterine tightening. SEEK IMMEDIATE MEDICAL CARE IF: You have vaginal bleeding and have placenta previa.    This information is not intended to replace advice given to you by your health care provider. Make sure you discuss any questions you have with  your health care provider.   Document Released: 09/01/2010 Document Revised: 07/30/2011 Document Reviewed: 11/08/2014 Elsevier Interactive Patient Education [employer].

## 2015-04-13 NOTE — MAU Note (Signed)
Pt reports she went to the BR at work and saw bleeing. C/o mild abd cramping as well.

## 2015-04-13 NOTE — MAU Provider Note (Signed)
History     CSN: [id number]  Arrival date and time: 04/13/15 1316   First Provider Initiated Contact with Patient 04/13/15 1410          Chief Complaint  Patient presents with  . Vaginal Bleeding   HPI  Jamie Mcbride is a 39 y.o. G2P0010 at [redacted]w[redacted]d who presents for vaginal bleeding.  One episode of red spotting on toilet paper after bowel movement at 1230. States BM was normal for her, no straining. No bleeding since then.  Just recently treated for BV & yeast infection.  Reports mild lower abdominal cramping that has been ongoing during pregnancy. Rates as 2/10. + nausea, no vomiting.  Denies diarrhea or constipation.  Last intercourse was over a month ago.    OB History    Gravida Para Term Preterm AB TAB SAB Ectopic Multiple Living   2 0 0 0 1 0 1 0 0 0       Past Medical History  Diagnosis Date  . History of appendectomy 2001  . Hypertension     Past Surgical History  Procedure Laterality Date  . Appendectomy  2001  . Appendectomy      Family History  Problem Relation Age of Onset  . Diabetes Father   . Hypertension Father   . Other Neg Hx     Social History  Substance Use Topics  . Smoking status: Never Smoker   . Smokeless tobacco: Never Used  . Alcohol Use: No    Allergies: No Known Allergies  Prescriptions prior to admission  Medication Sig Dispense Refill Last Dose  . labetalol (NORMODYNE) 200 MG tablet Take 1 tablet (200 mg total) by mouth 2 (two) times daily. 60 tablet 3 04/13/2015 at Unknown time  . metroNIDAZOLE (METROGEL VAGINAL) 0.75 % vaginal gel Place 1 Applicatorful vaginally 2 (two) times daily. 70 g 0 04/12/2015 at Unknown time  . Prenatal Vit-Fe Fumarate-FA (PRENATAL MULTIVITAMIN) TABS Take 1 tablet by mouth at bedtime.   04/13/2015 at Unknown time  . terconazole (TERAZOL 7) 0.4 % vaginal cream Place 1 applicator vaginally at bedtime. 45 g 0 04/12/2015 at Unknown time    Review of Systems  Constitutional: Negative.    Gastrointestinal: Positive for nausea and abdominal pain. Negative for vomiting, diarrhea and constipation.  Genitourinary: Negative for dysuria.       + vaginal bleeding + vaginal discharge   Physical Exam   Blood pressure 148/86, pulse 68, temperature 99 F (37.2 C), resp. rate 18, height 5\' 3"  (1.6 m), weight 211 lb 6.4 oz (95.89 kg), last menstrual period 02/04/2015, unknown if currently breastfeeding.  Physical Exam  Nursing note and vitals reviewed. Constitutional: She is oriented to person, place, and time. She appears well-developed and well-nourished. No distress.  HENT:  Head: Normocephalic and atraumatic.  Eyes: Conjunctivae are normal. Right eye exhibits no discharge. Left eye exhibits no discharge. No scleral icterus.  Neck: Normal range of motion.  Cardiovascular: Normal rate, regular rhythm and normal heart sounds.   No murmur heard. Respiratory: Effort normal and breath sounds normal. No respiratory distress. She has no wheezes.  GI: Soft. Bowel sounds are normal. She exhibits no distension. There is no tenderness. There is no guarding.  Genitourinary: Cervix exhibits discharge (small amount of thick white discharge). Cervix exhibits no friability. There is bleeding (small amount of thin bloody discharge on external genitalia) in the vagina.  Cervix closed Minimal amount of pink discharge at cervix - barely enough to discolor the end of the fox  swab  Neurological: She is alert and oriented to person, place, and time.  Skin: Skin is warm and dry. She is not diaphoretic.  Psychiatric: She has a normal mood and affect. Her behavior is normal. Judgment and thought content normal.    MAU Course  Procedures Results for orders placed or performed during the hospital encounter of 04/13/15 (from the past 24 hour(s))  Urinalysis, Routine w reflex microscopic (not at [facility])     Status: Abnormal   Collection Time: 04/13/15  1:25 PM  Result Value Ref Range   Color, Urine YELLOW  YELLOW   APPearance CLEAR CLEAR   Specific Gravity, Urine 1.025 1.005 - 1.030   pH 5.5 5.0 - 8.0   Glucose, UA NEGATIVE NEGATIVE mg/dL   Hgb urine dipstick TRACE (A) NEGATIVE   Bilirubin Urine NEGATIVE NEGATIVE   Ketones, ur 40 (A) NEGATIVE mg/dL   Protein, ur NEGATIVE NEGATIVE mg/dL   Nitrite NEGATIVE NEGATIVE   Leukocytes, UA NEGATIVE NEGATIVE  Urine microscopic-add on     Status: Abnormal   Collection Time: 04/13/15  1:25 PM  Result Value Ref Range   Squamous Epithelial / LPF 0-5 (A) NONE SEEN   WBC, UA NONE SEEN 0 - 5 WBC/hpf   RBC / HPF 0-5 0 - 5 RBC/hpf   Bacteria, UA NONE SEEN NONE SEEN  CBC     Status: Abnormal   Collection Time: 04/13/15  1:33 PM  Result Value Ref Range   WBC 5.1 4.0 - 10.5 K/uL   RBC 5.19 (H) 3.87 - 5.11 MIL/uL   Hemoglobin 11.3 (L) 12.0 - 15.0 g/dL   HCT 33.1 (L) 36.0 - 46.0 %   MCV 63.8 (L) 78.0 - 100.0 fL   MCH 21.8 (L) 26.0 - 34.0 pg   MCHC 34.1 30.0 - 36.0 g/dL   RDW 15.7 (H) 11.5 - 15.5 %   Platelets 132 (L) 150 - 400 K/uL  hCG, quantitative, pregnancy     Status: Abnormal   Collection Time: 04/13/15  1:38 PM  Result Value Ref Range   hCG, Beta Chain, Quant, S 13036 (H) <5 mIU/mL    MDM O positive BHCG & CBC ordered Ultrasound on 11/22 - impression states gestational sac; no fetal pole or yolk sac seen. This is conflicting with other portion of same report that states yolk sac was visualized. Spoke with ultrasound - definite yolk sac seen on ultrasound, MFM doc will update final report. This information was obtained after I ordered the BHCG & ultrasound; discontinued today's ultrasound order.   Assessment and Plan  A: 1. Vaginal bleeding in pregnancy, first trimester    P: Discharge home Keep schedule appt with ultrasound on 12/2 Discussed reasons to return to [place], NP  04/13/2015, 2:07 PM

## 2015-04-20 ENCOUNTER — Inpatient Hospital Stay (HOSPITAL_COMMUNITY)
Admission: AD | Admit: 2015-04-20 | Discharge: 2015-04-20 | Disposition: A | Discharge status: Home / Self Care | Payer: BLUE CROSS/BLUE SHIELD | Source: Ambulatory Visit | Attending: Obstetrics & Gynecology | Admitting: Obstetrics & Gynecology

## 2015-04-20 ENCOUNTER — Encounter (HOSPITAL_COMMUNITY): Payer: Self-pay

## 2015-04-20 ENCOUNTER — Inpatient Hospital Stay (HOSPITAL_COMMUNITY): Payer: BLUE CROSS/BLUE SHIELD

## 2015-04-20 DIAGNOSIS — O039 Complete or unspecified spontaneous abortion without complication: Secondary | ICD-10-CM | POA: Diagnosis not present

## 2015-04-20 DIAGNOSIS — O209 Hemorrhage in early pregnancy, unspecified: Secondary | ICD-10-CM | POA: Diagnosis present

## 2015-04-20 LAB — CBC
HEMATOCRIT: 31.2 % — AB (ref 36.0–46.0)
HEMOGLOBIN: 10.5 g/dL — AB (ref 12.0–15.0)
MCH: 21.3 pg — AB (ref 26.0–34.0)
MCHC: 33.7 g/dL (ref 30.0–36.0)
MCV: 63.2 fL — ABNORMAL LOW (ref 78.0–100.0)
Platelets: 160 10*3/uL (ref 150–400)
RBC: 4.94 MIL/uL (ref 3.87–5.11)
RDW: 15.6 % — AB (ref 11.5–15.5)
WBC: 6.2 10*3/uL (ref 4.0–10.5)

## 2015-04-20 LAB — HCG, QUANTITATIVE, PREGNANCY: hCG, Beta Chain, Quant, S: 7993 m[IU]/mL — ABNORMAL HIGH (ref <5)

## 2015-04-20 MED ORDER — LACTATED RINGERS IV BOLUS (SEPSIS)
1000.0000 mL | Freq: Once | INTRAVENOUS | Status: AC
  Administered 2015-04-20: 1000 mL via INTRAVENOUS

## 2015-04-20 MED ORDER — OXYCODONE-ACETAMINOPHEN 5-325 MG PO TABS: 1.0000 | ORAL_TABLET | Freq: Four times a day (QID) | ORAL | Status: DC | PRN

## 2015-04-20 MED ORDER — HYDROMORPHONE HCL 1 MG/ML IJ SOLN
1.0000 mg | Freq: Once | INTRAMUSCULAR | Status: AC
  Administered 2015-04-20: 1 mg via INTRAVENOUS
  Filled 2015-04-20: qty 1

## 2015-04-20 MED ORDER — MISOPROSTOL 200 MCG PO TABS: 800.0000 ug | ORAL_TABLET | Freq: Once | ORAL | Status: DC

## 2015-04-20 NOTE — MAU Provider Note (Signed)
Chief Complaint: Vaginal Bleeding   First Provider Initiated Contact with Patient 04/20/15 1933      SUBJECTIVE HPI: Jamie Mcbride is a 38 y.o. G2P0010 at [redacted]w[redacted]d by LMP who presents to maternity admissions reporting heavy vaginal bleeding and abdominal cramping with onset at 6 pm tonight.  She was seen in MAU on 11/23 for spotting and had US showing GS and YS, with finding suspicious for but not definitive for failed pregnancy due to small GS size.  F/U ultrasound scheduled for this Friday for definitive diagnosis.  Pt has not tried anything for her pain or bleeding and came straight to the hospital after it started. She is crying and breathing with pain upon arrival in MAU. She denies vaginal itching/burning, urinary symptoms, h/a, dizziness, n/v, or fever/chills.     HPI  Past Medical History  Diagnosis Date  . History of appendectomy 2001  . Hypertension    Past Surgical History  Procedure Laterality Date  . Appendectomy  2001  . Appendectomy     Social History   Social History  . Marital Status: Married    Spouse Name: N/A  . Number of Children: N/A  . Years of Education: N/A   Occupational History  . Not on file.   Social History Main Topics  . Smoking status: Never Smoker   . Smokeless tobacco: Never Used  . Alcohol Use: No  . Drug Use: No  . Sexual Activity:    Partners: Male    Birth Control/ Protection: None   Other Topics Concern  . Not on file   Social History Narrative   No current facility-administered medications on file prior to encounter.   Current Outpatient Prescriptions on File Prior to Encounter  Medication Sig Dispense Refill  . labetalol (NORMODYNE) 200 MG tablet Take 1 tablet (200 mg total) by mouth 2 (two) times daily. 60 tablet 3  . metroNIDAZOLE (METROGEL VAGINAL) 0.75 % vaginal gel Place 1 Applicatorful vaginally 2 (two) times daily. 70 g 0  . Prenatal Vit-Fe Fumarate-FA (PRENATAL MULTIVITAMIN) TABS Take 1 tablet by mouth at bedtime.     Marland Kitchen terconazole (TERAZOL 7) 0.4 % vaginal cream Place 1 applicator vaginally at bedtime. 45 g 0   No Known Allergies  ROS:  Review of Systems  Constitutional: Negative for fever, chills and fatigue.  HENT: Negative for sinus pressure.   Eyes: Negative for photophobia.  Respiratory: Negative for shortness of breath.   Cardiovascular: Negative for chest pain.  Gastrointestinal: Negative for nausea, vomiting, diarrhea and constipation.  Genitourinary: Negative for dysuria, frequency, flank pain, vaginal bleeding, vaginal discharge, difficulty urinating, vaginal pain and pelvic pain.  Musculoskeletal: Negative for neck pain.  Neurological: Negative for dizziness, weakness and headaches.  Psychiatric/Behavioral: Negative.      I have reviewed patient's Past Medical Hx, Surgical Hx, Family Hx, Social Hx, medications and allergies.   Physical Exam  No data found. VS wnl  Constitutional: Well-developed, well-nourished female in no acute distress.  Cardiovascular: normal rate Respiratory: normal effort GI: Abd soft, non-tender. Pos BS x 4 MS: Extremities nontender, no edema, normal ROM Neurologic: Alert and oriented x 4.  GU: Neg CVAT.  PELVIC EXAM: Cervix pink, visually open 1 cm, without lesion, moderate amount dark red bleeding with small clots, vaginal walls and external genitalia normal   LAB RESULTS No results found for this or any previous visit (from the past 24 hour(s)). Results for orders placed or performed during the hospital encounter of 04/20/15 (from the past 168  hour(s))  hCG, quantitative, pregnancy   Collection Time: 04/20/15  8:19 PM  Result Value Ref Range   hCG, Beta Chain, Quant, S 7993 (H) <5 mIU/mL  CBC   Collection Time: 04/20/15  8:19 PM  Result Value Ref Range   WBC 6.2 4.0 - 10.5 K/uL   RBC 4.94 3.87 - 5.11 MIL/uL   Hemoglobin 10.5 (L) 12.0 - 15.0 g/dL   HCT 31.2 (L) 36.0 - 46.0 %   MCV 63.2 (L) 78.0 - 100.0 fL   MCH 21.3 (L) 26.0 - 34.0 pg    MCHC 33.7 30.0 - 36.0 g/dL   RDW 15.6 (H) 11.5 - 15.5 %   Platelets 160 150 - 400 K/uL    O/POS/-- (11/11 1037)  IMAGING US Ob Transvaginal  04/20/2015  CLINICAL DATA:  Vaginal bleeding and severe pain EXAM: TRANSVAGINAL OB ULTRASOUND TECHNIQUE: Transvaginal ultrasound was performed for complete evaluation of the gestation as well as the maternal uterus, adnexal regions, and pelvic cul-de-sac. COMPARISON:  04/12/2015 FINDINGS: Intrauterine gestational sac: None Yolk sac:  No Embryo:  No Cardiac Activity: No Heart Rate:  bpm MSD:   mm    w     d CRL:     mm    w  d                  [place] EDC: Maternal uterus/adnexae: Scant volume free pelvic fluid. There is blood or fluid in the endocervical canal. Intrauterine sac is no longer visible. IMPRESSION: Probable early pregnancy failure. Recommend follow-up quantitative B-HCG levels and follow-up US in 14 days to confirm and assess viability. This recommendation follows SRU consensus guidelines: Diagnostic Criteria for Nonviable Pregnancy Early in the First Trimester. [employer] Med [id number]. Electronically Signed   By: Andreas Newport M.D.   On: 04/20/2015 22:13   [place] Mfm Ob Comp Less 14 Wks  04/13/2015  OBSTETRICAL ULTRASOUND: This exam was performed within a [facility]. The OB US report was generated in the AS system, and faxed to the ordering physician.  This report is available in the [facility]. See the [facility] Obstetric US report via the Image Link.  [place] Mfm Ob Transvaginal  04/13/2015  OBSTETRICAL ULTRASOUND: This exam was performed within a [facility]. The OB US report was generated in the [facility] system, and faxed to the ordering physician.  This report is available in the [facility]. See the [facility] Obstetric US report via the Image Link.   MAU Management/MDM: Ordered labs and reviewed results.  Consult Dr Elonda Husky. Pt stable with small/moderate bleeding.  Offered Cytotec, pt unsure. Rx written for Cytotec  for pt to place vaginally if desired. Plan to f/u in 1 week with Dr Jodi Mourning.  Treatments in MAU included Dilaudid 1 mg IV with significant reduction in pain. Pt stable at time of discharge.  ASSESSMENT 1. SAB (spontaneous abortion)     PLAN Discharge home with bleeding precautions F/U with Dr Jodi Mourning in 1 week Percocet 5/325, take 1-2 Q 6 hours PRN x 10  Follow-up Information    Follow up with HARPER,CHARLES A, MD. Schedule an appointment as soon as possible for a visit in 1 week.   Specialty:  Obstetrics and Gynecology   Why:  Return to MAU as needed for emergencies   Contact information:   [place] [address] [phone]       Fatima Blank Certified Nurse-Midwife 04/22/2015  8:06 PM

## 2015-04-20 NOTE — Discharge Instructions (Signed)
Miscarriage  A miscarriage is the sudden loss of an unborn baby (fetus) before the 20th week of pregnancy. Most miscarriages happen in the first 3 months of pregnancy. Sometimes, it happens before a woman even knows she is pregnant. A miscarriage is also called a "spontaneous miscarriage" or "early pregnancy loss." Having a miscarriage can be an emotional experience. Talk with your caregiver about any questions you may have about miscarrying, the grieving process, and your future pregnancy plans.  CAUSES    Problems with the fetal chromosomes that make it impossible for the baby to develop normally. Problems with the baby's genes or chromosomes are most often the result of errors that occur, by chance, as the embryo divides and grows. The problems are not inherited from the parents.   Infection of the cervix or uterus.    Hormone problems.    Problems with the cervix, such as having an incompetent cervix. This is when the tissue in the cervix is not strong enough to hold the pregnancy.    Problems with the uterus, such as an abnormally shaped uterus, uterine fibroids, or congenital abnormalities.    Certain medical conditions.    Smoking, drinking alcohol, or taking illegal drugs.    Trauma.   Often, the cause of a miscarriage is unknown.   SYMPTOMS    Vaginal bleeding or spotting, with or without cramps or pain.   Pain or cramping in the abdomen or lower back.   Passing fluid, tissue, or blood clots from the vagina.  DIAGNOSIS   Your caregiver will perform a physical exam. You may also have an ultrasound to confirm the miscarriage. Blood or urine tests may also be ordered.  TREATMENT    Sometimes, treatment is not necessary if you naturally pass all the fetal tissue that was in the uterus. If some of the fetus or placenta remains in the body (incomplete miscarriage), tissue left behind may become infected and must be removed. Usually, a dilation and curettage (D and C) procedure is performed.  During a D and C procedure, the cervix is widened (dilated) and any remaining fetal or placental tissue is gently removed from the uterus.   Antibiotic medicines are prescribed if there is an infection. Other medicines may be given to reduce the size of the uterus (contract) if there is a lot of bleeding.   If you have Rh negative blood and your baby was Rh positive, you will need a Rh immunoglobulin shot. This shot will protect any future baby from having Rh blood problems in future pregnancies.  HOME CARE INSTRUCTIONS    Your caregiver may order bed rest or may allow you to continue light activity. Resume activity as directed by your caregiver.   Have someone help with home and family responsibilities during this time.    Keep track of the number of sanitary pads you use each day and how soaked (saturated) they are. Write down this information.    Do not use tampons. Do not douche or have sexual intercourse until approved by your caregiver.    Only take over-the-counter or prescription medicines for pain or discomfort as directed by your caregiver.    Do not take aspirin. Aspirin can cause bleeding.    Keep all follow-up appointments with your caregiver.    If you or your partner have problems with grieving, talk to your caregiver or seek counseling to help cope with the pregnancy loss. Allow enough time to grieve before trying to get pregnant again.     SEEK IMMEDIATE MEDICAL CARE IF:    You have severe cramps or pain in your back or abdomen.   You have a fever.   You pass large blood clots (walnut-sized or larger) ortissue from your vagina. Save any tissue for your caregiver to inspect.    Your bleeding increases.    You have a thick, bad-smelling vaginal discharge.   You become lightheaded, weak, or you faint.    You have chills.   MAKE SURE YOU:   Understand these instructions.   Will watch your condition.   Will get help right away if you are not doing well or get worse.     This  information is not intended to replace advice given to you by your health care provider. Make sure you discuss any questions you have with your health care provider.     Document Released: 10/31/2000 Document Revised: 09/01/2012 Document Reviewed: 06/26/2011  Elsevier Interactive Patient Education 2016 [employer]

## 2015-04-20 NOTE — MAU Note (Signed)
Pt c/o severe pain starting 30 mins ago. Pt was here last week with spotting. Today pt noticed increase in bleeding and some clots.

## 2015-04-22 ENCOUNTER — Encounter (HOSPITAL_COMMUNITY): Payer: Medicaid Other

## 2015-04-22 ENCOUNTER — Ambulatory Visit (HOSPITAL_COMMUNITY)
Admission: RE | Admit: 2015-04-22 | Discharge: 2015-04-22 | Disposition: A | Discharge status: Home / Self Care | Payer: BLUE CROSS/BLUE SHIELD | Source: Ambulatory Visit | Attending: Certified Nurse Midwife | Admitting: Certified Nurse Midwife

## 2015-04-22 ENCOUNTER — Inpatient Hospital Stay (HOSPITAL_COMMUNITY): Admission: RE | Admit: 2015-04-22 | Payer: BLUE CROSS/BLUE SHIELD | Source: Ambulatory Visit

## 2015-04-25 ENCOUNTER — Other Ambulatory Visit: Payer: Self-pay | Admitting: Certified Nurse Midwife

## 2015-04-25 ENCOUNTER — Encounter: Payer: Self-pay | Admitting: Obstetrics

## 2015-04-25 ENCOUNTER — Ambulatory Visit: Payer: BLUE CROSS/BLUE SHIELD | Admitting: Obstetrics

## 2015-04-25 ENCOUNTER — Ambulatory Visit (INDEPENDENT_AMBULATORY_CARE_PROVIDER_SITE_OTHER): Payer: BLUE CROSS/BLUE SHIELD | Admitting: Obstetrics

## 2015-04-25 VITALS — BP 146/82 | HR 78

## 2015-04-25 DIAGNOSIS — O200 Threatened abortion: Secondary | ICD-10-CM | POA: Diagnosis not present

## 2015-04-25 DIAGNOSIS — O039 Complete or unspecified spontaneous abortion without complication: Secondary | ICD-10-CM | POA: Diagnosis not present

## 2015-04-25 NOTE — Progress Notes (Addendum)
Patient ID: Jamie Mcbride, female   DOB: November 21, 1975, 39 y.o.   MRN: [id number]  Chief Complaint  Patient presents with  . Follow-up    threatened ab    HPI Jamie Mcbride is a 39 y.o. female.  Presents for F/U after SAB, complete.  Quantitative beta HCG fell from ~ 14, 000 to 7000.  Ultrasound showed no IUP, normal nonpregnant appearing uterus, ovaries and tubes.  Patient denies vaginal bleeding or cramping. HPI  Past Medical History  Diagnosis Date  . History of appendectomy 2001  . Hypertension     Past Surgical History  Procedure Laterality Date  . Appendectomy  2001  . Appendectomy      Family History  Problem Relation Age of Onset  . Diabetes Father   . Hypertension Father   . Other Neg Hx     Social History Social History  Substance Use Topics  . Smoking status: Never Smoker   . Smokeless tobacco: Never Used  . Alcohol Use: No    No Known Allergies  Current Outpatient Prescriptions  Medication Sig Dispense Refill  . labetalol (NORMODYNE) 200 MG tablet Take 1 tablet (200 mg total) by mouth 2 (two) times daily. 60 tablet 3  . metroNIDAZOLE (METROGEL VAGINAL) 0.75 % vaginal gel Place 1 Applicatorful vaginally 2 (two) times daily. 70 g 0  . misoprostol (CYTOTEC) 200 MCG tablet Place 4 tablets (800 mcg total) vaginally once. 4 tablet 1  . oxyCODONE-acetaminophen (PERCOCET/ROXICET) 5-325 MG tablet Take 1-2 tablets by mouth every 6 (six) hours as needed. 10 tablet 0  . Prenatal Vit-Fe Fumarate-FA (PRENATAL MULTIVITAMIN) TABS Take 1 tablet by mouth at bedtime.    Marland Kitchen terconazole (TERAZOL 7) 0.4 % vaginal cream Place 1 applicator vaginally at bedtime. 45 g 0   No current facility-administered medications for this visit.    Review of Systems Review of Systems Constitutional: negative for fatigue and weight loss Respiratory: negative for cough and wheezing Cardiovascular: negative for chest pain, fatigue and palpitations Gastrointestinal: negative for abdominal pain  and change in bowel habits Genitourinary:negative Integument/breast: negative for nipple discharge Musculoskeletal:negative for myalgias Neurological: negative for gait problems and tremors Behavioral/Psych: negative for abusive relationship, depression Endocrine: negative for temperature intolerance     Blood pressure 146/82, pulse 78, last menstrual period 02/04/2015, unknown if currently breastfeeding.  Physical Exam Physical Exam:  Deferred  100% of 10 min visit spent on counseling and coordination of care.   Data Reviewed Beta HCG Ultrasounds  Assessment     SAB, complete.  Need to repeat quantitative beta HCG to demonstrate falling levels.      Plan    Quantitative beta HCG F/U 2 weeks  Orders Placed This Encounter  Procedures  . B-HCG Quant   No orders of the defined types were placed in this encounter.

## 2015-04-26 LAB — HCG, QUANTITATIVE, PREGNANCY: hCG, Beta Chain, Quant, S: 628 m[IU]/mL — ABNORMAL HIGH

## 2015-04-27 ENCOUNTER — Encounter: Payer: BLUE CROSS/BLUE SHIELD | Admitting: Certified Nurse Midwife

## 2015-04-28 ENCOUNTER — Telehealth: Payer: Self-pay | Admitting: *Deleted

## 2015-04-28 NOTE — Telephone Encounter (Signed)
Pt called to office for lab results.  Attempt to contact pt.  No answer, no VM.  Pt had quant drawn on Monday.  Needs reviewed with pt.  Per Dr Jodi Mourning, pt may come for repeat Quant in 2 weeks to verify end of pregnancy.

## 2015-04-29 ENCOUNTER — Encounter: Payer: BLUE CROSS/BLUE SHIELD | Admitting: Certified Nurse Midwife

## 2015-04-29 NOTE — Telephone Encounter (Signed)
Spoke with pt regarding lab results.  Pt has been scheduled 2 weeks from last draw for repeat Quant.

## 2015-05-09 ENCOUNTER — Other Ambulatory Visit: Payer: BLUE CROSS/BLUE SHIELD

## 2015-05-09 ENCOUNTER — Other Ambulatory Visit: Payer: Self-pay | Admitting: Obstetrics

## 2015-05-09 DIAGNOSIS — O021 Missed abortion: Secondary | ICD-10-CM

## 2015-05-09 DIAGNOSIS — I10 Essential (primary) hypertension: Secondary | ICD-10-CM

## 2015-05-09 DIAGNOSIS — O039 Complete or unspecified spontaneous abortion without complication: Secondary | ICD-10-CM

## 2015-05-10 LAB — HCG, QUANTITATIVE, PREGNANCY: hCG, Beta Chain, Quant, S: 6.1 m[IU]/mL — ABNORMAL HIGH

## 2015-07-25 ENCOUNTER — Other Ambulatory Visit: Payer: Self-pay | Admitting: Endocrinology

## 2015-07-25 DIAGNOSIS — E221 Hyperprolactinemia: Secondary | ICD-10-CM

## 2015-08-12 ENCOUNTER — Ambulatory Visit
Admission: RE | Admit: 2015-08-12 | Discharge: 2015-08-12 | Disposition: A | Discharge status: Home / Self Care | Payer: BLUE CROSS/BLUE SHIELD | Source: Ambulatory Visit | Attending: Endocrinology | Admitting: Endocrinology

## 2015-08-12 DIAGNOSIS — E221 Hyperprolactinemia: Secondary | ICD-10-CM

## 2015-08-12 MED ORDER — GADOBENATE DIMEGLUMINE 529 MG/ML IV SOLN
10.0000 mL | Freq: Once | INTRAVENOUS | Status: AC | PRN
  Administered 2015-08-12: 10 mL via INTRAVENOUS

## 2015-10-03 ENCOUNTER — Encounter (HOSPITAL_BASED_OUTPATIENT_CLINIC_OR_DEPARTMENT_OTHER): Payer: Self-pay | Admitting: *Deleted

## 2015-10-05 ENCOUNTER — Encounter (HOSPITAL_BASED_OUTPATIENT_CLINIC_OR_DEPARTMENT_OTHER): Payer: Self-pay | Admitting: *Deleted

## 2015-10-05 NOTE — Progress Notes (Signed)
NPO AFTER MN WITH EXCEPTION CLEAR LIQUIDS UNTIL 0930 (NO CREAM/ MILK PRODUCTS).  ARRIVE AT [id number].  NEED ISTAT , EKG,  AND URINE PREG.  WILL TAKE LABETALOL AM DOS W/ SIPS OF WATER.

## 2015-10-10 NOTE — Progress Notes (Signed)
Times changed. Left message on answering machine for pt to arrive at 1300 tomorrow.

## 2015-10-11 ENCOUNTER — Encounter (HOSPITAL_BASED_OUTPATIENT_CLINIC_OR_DEPARTMENT_OTHER)
Admission: RE | Disposition: A | Discharge status: Home / Self Care | Payer: Self-pay | Source: Ambulatory Visit | Attending: Obstetrics and Gynecology

## 2015-10-11 ENCOUNTER — Other Ambulatory Visit: Payer: Self-pay

## 2015-10-11 ENCOUNTER — Encounter (HOSPITAL_BASED_OUTPATIENT_CLINIC_OR_DEPARTMENT_OTHER): Payer: Self-pay | Admitting: *Deleted

## 2015-10-11 ENCOUNTER — Ambulatory Visit (HOSPITAL_BASED_OUTPATIENT_CLINIC_OR_DEPARTMENT_OTHER): Payer: BLUE CROSS/BLUE SHIELD | Admitting: Anesthesiology

## 2015-10-11 ENCOUNTER — Ambulatory Visit (HOSPITAL_BASED_OUTPATIENT_CLINIC_OR_DEPARTMENT_OTHER)
Admission: RE | Admit: 2015-10-11 | Discharge: 2015-10-11 | Disposition: A | Discharge status: Home / Self Care | Payer: BLUE CROSS/BLUE SHIELD | Source: Ambulatory Visit | Attending: Obstetrics and Gynecology | Admitting: Obstetrics and Gynecology

## 2015-10-11 DIAGNOSIS — N979 Female infertility, unspecified: Secondary | ICD-10-CM | POA: Insufficient documentation

## 2015-10-11 DIAGNOSIS — D252 Subserosal leiomyoma of uterus: Secondary | ICD-10-CM | POA: Diagnosis not present

## 2015-10-11 DIAGNOSIS — N96 Recurrent pregnancy loss: Secondary | ICD-10-CM | POA: Diagnosis present

## 2015-10-11 DIAGNOSIS — I10 Essential (primary) hypertension: Secondary | ICD-10-CM | POA: Insufficient documentation

## 2015-10-11 DIAGNOSIS — N736 Female pelvic peritoneal adhesions (postinfective): Secondary | ICD-10-CM | POA: Insufficient documentation

## 2015-10-11 HISTORY — DX: Other specified abnormal findings of blood chemistry: R79.89

## 2015-10-11 HISTORY — DX: Personal history of traumatic brain injury: Z87.820

## 2015-10-11 HISTORY — DX: Female pelvic peritoneal adhesions (postinfective): N73.6

## 2015-10-11 HISTORY — DX: Hyperfunction of pituitary gland, unspecified: E22.9

## 2015-10-11 HISTORY — PX: LAPAROSCOPY: SHX197

## 2015-10-11 LAB — POCT I-STAT 4, (NA,K, GLUC, HGB,HCT)
Glucose, Bld: 82 mg/dL (ref 65–99)
HCT: 43 % (ref 36.0–46.0)
HEMOGLOBIN: 14.6 g/dL (ref 12.0–15.0)
Potassium: 4 mmol/L (ref 3.5–5.1)
SODIUM: 141 mmol/L (ref 135–145)

## 2015-10-11 LAB — POCT PREGNANCY, URINE: Preg Test, Ur: NEGATIVE

## 2015-10-11 SURGERY — LAPAROSCOPY OPERATIVE
Anesthesia: General | Site: Abdomen

## 2015-10-11 MED ORDER — ROCURONIUM BROMIDE 100 MG/10ML IV SOLN
INTRAVENOUS | Status: DC | PRN
Start: 2015-10-11 — End: 2015-10-11
  Administered 2015-10-11: 20 mg via INTRAVENOUS

## 2015-10-11 MED ORDER — OXYCODONE-ACETAMINOPHEN 5-325 MG PO TABS
ORAL_TABLET | ORAL | Status: AC
  Filled 2015-10-11: qty 1

## 2015-10-11 MED ORDER — KETOROLAC TROMETHAMINE 30 MG/ML IJ SOLN
INTRAMUSCULAR | Status: DC | PRN
  Administered 2015-10-11: 30 mg via INTRAVENOUS

## 2015-10-11 MED ORDER — GLYCOPYRROLATE 0.2 MG/ML IJ SOLN
INTRAMUSCULAR | Status: DC | PRN
  Administered 2015-10-11: 0.2 mg via INTRAVENOUS
  Administered 2015-10-11: 0.4 mg via INTRAVENOUS

## 2015-10-11 MED ORDER — MEPERIDINE HCL 25 MG/ML IJ SOLN
6.2500 mg | INTRAMUSCULAR | Status: DC | PRN
  Filled 2015-10-11: qty 1

## 2015-10-11 MED ORDER — FENTANYL CITRATE (PF) 100 MCG/2ML IJ SOLN
INTRAMUSCULAR | Status: DC | PRN
  Administered 2015-10-11: 50 ug via INTRAVENOUS
  Administered 2015-10-11 (×2): 25 ug via INTRAVENOUS

## 2015-10-11 MED ORDER — GLYCOPYRROLATE 0.2 MG/ML IJ SOLN
INTRAMUSCULAR | Status: AC
  Filled 2015-10-11: qty 1

## 2015-10-11 MED ORDER — PROPOFOL 10 MG/ML IV BOLUS
INTRAVENOUS | Status: AC
  Filled 2015-10-11: qty 20

## 2015-10-11 MED ORDER — METHYLENE BLUE 0.5 % INJ SOLN
INTRAVENOUS | Status: DC | PRN
  Administered 2015-10-11: 2 mL

## 2015-10-11 MED ORDER — OXYCODONE-ACETAMINOPHEN 5-325 MG PO TABS
1.0000 | ORAL_TABLET | ORAL | Status: DC | PRN
  Administered 2015-10-11: 1 via ORAL
  Filled 2015-10-11: qty 1

## 2015-10-11 MED ORDER — NEOSTIGMINE METHYLSULFATE 10 MG/10ML IV SOLN
INTRAVENOUS | Status: DC | PRN
  Administered 2015-10-11: 3 mg via INTRAVENOUS

## 2015-10-11 MED ORDER — LACTATED RINGERS IR SOLN
Status: DC | PRN
  Administered 2015-10-11: 3000 mL

## 2015-10-11 MED ORDER — PROPOFOL 10 MG/ML IV BOLUS
INTRAVENOUS | Status: DC | PRN
  Administered 2015-10-11: 200 mg via INTRAVENOUS

## 2015-10-11 MED ORDER — NEOSTIGMINE METHYLSULFATE 10 MG/10ML IV SOLN
INTRAVENOUS | Status: AC
  Filled 2015-10-11: qty 1

## 2015-10-11 MED ORDER — MIDAZOLAM HCL 5 MG/5ML IJ SOLN
INTRAMUSCULAR | Status: DC | PRN
  Administered 2015-10-11: 2 mg via INTRAVENOUS

## 2015-10-11 MED ORDER — LIDOCAINE HCL (CARDIAC) 20 MG/ML IV SOLN
INTRAVENOUS | Status: DC | PRN
Start: 2015-10-11 — End: 2015-10-11
  Administered 2015-10-11: 60 mg via INTRAVENOUS

## 2015-10-11 MED ORDER — SUCCINYLCHOLINE CHLORIDE 20 MG/ML IJ SOLN
INTRAMUSCULAR | Status: DC | PRN
  Administered 2015-10-11: 100 mg via INTRAVENOUS

## 2015-10-11 MED ORDER — CEFAZOLIN SODIUM-DEXTROSE 2-4 GM/100ML-% IV SOLN
2.0000 g | INTRAVENOUS | Status: AC
Start: 2015-10-12 — End: 2015-10-11
  Administered 2015-10-11: 2 g via INTRAVENOUS
  Filled 2015-10-11: qty 100

## 2015-10-11 MED ORDER — OXYCODONE-ACETAMINOPHEN 7.5-325 MG PO TABS: 1.0000 | ORAL_TABLET | ORAL | Status: DC | PRN

## 2015-10-11 MED ORDER — HYDROMORPHONE HCL 1 MG/ML IJ SOLN
0.2500 mg | INTRAMUSCULAR | Status: DC | PRN
  Filled 2015-10-11: qty 1

## 2015-10-11 MED ORDER — ONDANSETRON HCL 4 MG/2ML IJ SOLN
INTRAMUSCULAR | Status: DC | PRN
  Administered 2015-10-11: 4 mg via INTRAVENOUS

## 2015-10-11 MED ORDER — FENTANYL CITRATE (PF) 100 MCG/2ML IJ SOLN
INTRAMUSCULAR | Status: AC
  Filled 2015-10-11: qty 2

## 2015-10-11 MED ORDER — ROCURONIUM BROMIDE 50 MG/5ML IV SOLN
INTRAVENOUS | Status: AC
  Filled 2015-10-11: qty 1

## 2015-10-11 MED ORDER — ONDANSETRON HCL 4 MG/2ML IJ SOLN
4.0000 mg | Freq: Once | INTRAMUSCULAR | Status: DC | PRN
  Filled 2015-10-11: qty 2

## 2015-10-11 MED ORDER — LACTATED RINGERS IV SOLN
INTRAVENOUS | Status: DC
  Administered 2015-10-11 (×2): via INTRAVENOUS
  Filled 2015-10-11: qty 1000

## 2015-10-11 MED ORDER — GLYCOPYRROLATE 0.2 MG/ML IJ SOLN
INTRAMUSCULAR | Status: AC
  Filled 2015-10-11: qty 2

## 2015-10-11 MED ORDER — ONDANSETRON HCL 4 MG PO TABS: 4.0000 mg | ORAL_TABLET | Freq: Three times a day (TID) | ORAL | Status: DC | PRN

## 2015-10-11 MED ORDER — DEXAMETHASONE SODIUM PHOSPHATE 4 MG/ML IJ SOLN
INTRAMUSCULAR | Status: DC | PRN
  Administered 2015-10-11: 10 mg via INTRAVENOUS

## 2015-10-11 MED ORDER — CEFAZOLIN SODIUM-DEXTROSE 2-4 GM/100ML-% IV SOLN
INTRAVENOUS | Status: AC
  Filled 2015-10-11: qty 100

## 2015-10-11 MED ORDER — MIDAZOLAM HCL 2 MG/2ML IJ SOLN
INTRAMUSCULAR | Status: AC
  Filled 2015-10-11: qty 2

## 2015-10-11 MED ORDER — BUPIVACAINE-EPINEPHRINE (PF) 0.5% -1:200000 IJ SOLN
INTRAMUSCULAR | Status: DC | PRN
  Administered 2015-10-11: 10 mL

## 2015-10-11 SURGICAL SUPPLY — 53 items
BLADE SURG 11 STRL SS (BLADE) ×3 IMPLANT
CATH ROBINSON RED A/P 16FR (CATHETERS) IMPLANT
COVER MAYO STAND STRL (DRAPES) ×3 IMPLANT
DRAPE UNDERBUTTOCKS STRL (DRAPE) ×3 IMPLANT
ELECT NEEDLE TIP 2.8 STRL (NEEDLE) IMPLANT
ELECT REM PT RETURN 9FT ADLT (ELECTROSURGICAL) ×3
ELECTRODE REM PT RTRN 9FT ADLT (ELECTROSURGICAL) ×1 IMPLANT
EVACUATOR SMOKE 8.L (FILTER) IMPLANT
GLOVE BIO SURGEON STRL SZ8 (GLOVE) ×3 IMPLANT
GLOVE BIOGEL PI IND STRL 8.5 (GLOVE) ×1 IMPLANT
GLOVE BIOGEL PI INDICATOR 8.5 (GLOVE) ×2
GOWN STRL REUS W/ TWL LRG LVL3 (GOWN DISPOSABLE) ×2 IMPLANT
GOWN STRL REUS W/TWL LRG LVL3 (GOWN DISPOSABLE) ×4
KIT ROOM TURNOVER WOR (KITS) ×3 IMPLANT
LIQUID BAND (GAUZE/BANDAGES/DRESSINGS) ×3 IMPLANT
MANIPULATOR UTERINE 4.5 ZUMI (MISCELLANEOUS) ×3 IMPLANT
NEEDLE HYPO 25X1 1.5 SAFETY (NEEDLE) ×3 IMPLANT
NEEDLE INSUFFLATION 14GA 120MM (NEEDLE) ×3 IMPLANT
NS IRRIG 500ML POUR BTL (IV SOLUTION) ×3 IMPLANT
PACK BASIN DAY SURGERY FS (CUSTOM PROCEDURE TRAY) ×3 IMPLANT
PACK LAPAROSCOPY II (CUSTOM PROCEDURE TRAY) ×3 IMPLANT
PAD OB MATERNITY 4.3X12.25 (PERSONAL CARE ITEMS) ×3 IMPLANT
PADDING ION DISPOSABLE (MISCELLANEOUS) ×3 IMPLANT
PENCIL BUTTON HOLSTER BLD 10FT (ELECTRODE) IMPLANT
SCALPEL HARMONIC ACE (MISCELLANEOUS) IMPLANT
SEPRAFILM MEMBRANE 5X6 (MISCELLANEOUS) IMPLANT
SET IRRIG TUBING LAPAROSCOPIC (IRRIGATION / IRRIGATOR) ×3 IMPLANT
SOLUTION ANTI FOG 6CC (MISCELLANEOUS) ×3 IMPLANT
SUT MNCRL AB 4-0 PS2 18 (SUTURE) ×3 IMPLANT
SUT PROLENE 0 CT 1 30 (SUTURE) IMPLANT
SUT VIC AB 2-0 CT1 27 (SUTURE)
SUT VIC AB 2-0 CT1 TAPERPNT 27 (SUTURE) IMPLANT
SUT VIC AB 2-0 CT2 27 (SUTURE) IMPLANT
SUT VIC AB 2-0 UR6 27 (SUTURE) IMPLANT
SUT VIC AB 4-0 SH 27 (SUTURE) ×2
SUT VIC AB 4-0 SH 27XBRD (SUTURE) ×1 IMPLANT
SUT VICRYL 0 TIES 12 18 (SUTURE) IMPLANT
SYR 20CC LL (SYRINGE) IMPLANT
SYR 30ML LL (SYRINGE) IMPLANT
SYR 3ML 23GX1 SAFETY (SYRINGE) IMPLANT
SYR 5ML LL (SYRINGE) ×3 IMPLANT
SYR CONTROL 10ML LL (SYRINGE) ×3 IMPLANT
SYRINGE 12CC LL (MISCELLANEOUS) ×3 IMPLANT
TOWEL OR 17X24 6PK STRL BLUE (TOWEL DISPOSABLE) ×6 IMPLANT
TRAY DSU PREP LF (CUSTOM PROCEDURE TRAY) ×3 IMPLANT
TRAY FOLEY CATH SILVER 14FR (SET/KITS/TRAYS/PACK) ×3 IMPLANT
TROCAR OPTI TIP 5M 100M (ENDOMECHANICALS) ×3 IMPLANT
TROCAR XCEL DIL TIP R 11M (ENDOMECHANICALS) IMPLANT
TUBE CONNECTING 12'X1/4 (SUCTIONS) ×1
TUBE CONNECTING 12X1/4 (SUCTIONS) ×2 IMPLANT
TUBING INSUFFLATION 10FT LAP (TUBING) ×6 IMPLANT
WARMER LAPAROSCOPE (MISCELLANEOUS) ×3 IMPLANT
WATER STERILE IRR 500ML POUR (IV SOLUTION) ×3 IMPLANT

## 2015-10-11 NOTE — Op Note (Signed)
Operative Note  Preoperative diagnosis: Pelvic adhesions, infertility  Postoperative diagnosis: Bilateral peri-tubal and ovarian adhesions, infertility   Procedure: Laparoscopy, lysis of adhesions, electrosurgical excision of peritoneal lesion  Anesthesia: Gen. endotracheal  Complications: None  Estimated blood loss: <10 cc  Specimens: Adhesion band from the pelvis to pathology  Findings: On laparoscopy, upper abdomen, liver surface and diaphragm surfaces were normal. Gallbladder was grossly normal. The appendix was surgically absent. There were adhesions to the McBurney scar from the omentum.  The pelvic peritoneum looked mostly normal. There was a small anterior subserosal myoma. There were filmy adhesions binding down the superior aspect of the distal portion of the right tube to the cul-de-sac peritoneum. In addition the right ovary was less than 30% adherent with filmy adhesions to the posterior uterine serosa. The fimbriated end of the right tube was scored as 5 out of 5. The patient fimbrial portion of the left tube was bound down to the posterior cul-de-sac peritoneum with filmy adhesions. In addition the left ovary was adherent with 10% of its surface area to the pelvic sidewall with filmy adhesions. Both ovaries otherwise appeared normal. Chromotubation did not result in filling of the tubes but we considered this to be a technical artifact.  Description of the procedure: The patient was placed in dorsal supine position and general endotracheal anesthesia was given. 2 g of cefazolin were given intravenously for prophylaxis. Patient was placed in lithotomy position. She was prepped and draped inside manner.  The bladder was catheterized with a Foley catheter.. A ZUMI catheter was placed into the uterine cavity. The uterus sounded to 8 cm. The surgeon was regloved and a surgical field was created on the abdomen.  After preemptive anesthesia of all surgical sites with 0.25% bupivacaine  with 1 200,000 epinephrine, a 5 mm intraumbilical skin incision was made and a Verress needle was inserted. Its correct location was confirmed. A pneumoperitoneum was created with carbon dioxide.  5 mm laparoscope with a 30 lens was inserted and video laparoscopy was started . A left lower quadrant 5 mm and a right lower quadrant  5 mm incisions at the McBurney incision (the skin had scarred with a dimple at the site. It was undermined with Metzenbaum scissors and the dimple was eliminated for cosmetic reasons.)  were made and ancillary trochars were placed under direct visualization. Above findings were noted.  Using a needle electrode on 32 W of cutting current,first the adhesions between the right ovary and the uterus were taken down. We freed up the right tube from its adherence to the posterior cul-de-sac peritoneum and to the surface of the right ovary.  We then freed up the distal end of the left tube from its adherence to the posterior cul-de-sac peritoneum. The left ovary was also freed up with fine electrosurgical needle dissection. The abundant filmy tissue from the adnexa was extirpated and submitted to pathology.  We performed chromotubation but the tubes did not fill, we think this is a technical artifact.    The procedure was terminated. Pelvis was copiously irrigated and hemostasis was checked. The trochars were removed. The gas was allowed to escape. The incisions were approximated with Dermabond.   The patient tolerated the procedure well and was transferred to recovery room in satisfactory condition.  Governor Specking, MD

## 2015-10-11 NOTE — Transfer of Care (Signed)
Immediate Anesthesia Transfer of Care Note  Patient: Jamie Mcbride  Procedure(s) Performed: Procedure(s) (LRB): LAPROSCOPIC LYSIS OF ADHESION WITH LYSIS AND EXCISION OF RIGHT OVARY ADHESION (N/A)  Patient Location: PACU  Anesthesia Type: General  Level of Consciousness: awake, oriented, sedated and patient cooperative  Airway & Oxygen Therapy: Patient Spontanous Breathing and Patient connected to face mask oxygen  Post-op Assessment: Report given to PACU RN and Post -op Vital signs reviewed and stable  Post vital signs: Reviewed and stable  Complications: No apparent anesthesia complications

## 2015-10-11 NOTE — Anesthesia Postprocedure Evaluation (Signed)
Anesthesia Post Note  Patient: Ebany Chiara  Procedure(s) Performed: Procedure(s) (LRB): LAPROSCOPIC LYSIS OF ADHESION WITH LYSIS AND EXCISION OF RIGHT OVARY ADHESION (N/A)  Patient location during evaluation: PACU Anesthesia Type: General Level of consciousness: awake and alert Pain management: pain level controlled Vital Signs Assessment: post-procedure vital signs reviewed and stable Respiratory status: spontaneous breathing, nonlabored ventilation, respiratory function stable and patient connected to nasal cannula oxygen Cardiovascular status: blood pressure returned to baseline and stable Postop Assessment: no signs of nausea or vomiting Anesthetic complications: no    Last Vitals:  Filed Vitals:   10/11/15 1815 10/11/15 1845  BP: 176/105 164/82  Pulse: 59 61  Temp:  37 C  Resp: 8 14    Last Pain: There were no vitals filed for this visit.               [place]

## 2015-10-11 NOTE — Discharge Instructions (Signed)

## 2015-10-11 NOTE — H&P (Signed)
Jamie Mcbride is a 40 y.o. female , originally referred to me by Dr. Baltazar Najjar, for recurrent pregnancy loss.  By recent HSG she was diagnosed with left patency and a patient right fallopian tube with partial loculated spill, suggestive of pelvic adhesions. Patient would like to preserve her childbearing potential.  Pertinent Gynecological History: Menses: normal Bleeding: normal Contraception: none DES exposure: denies Blood transfusions: none Sexually transmitted diseases: no past history Previous GYN Procedures: none Last mammogram: normal Last pap: normal  OB History: G 2, SAB 2   Menstrual History: Menarche age: 89 No LMP recorded.    Past Medical History  Diagnosis Date  . Hypertension   . Pelvic adhesions   . 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 History of concussion     07-17-2013 W/ LOC (NEGATIVE CT)  NO RESIDUAL                    Past Surgical History  Procedure Laterality Date  . Appendectomy  2001             Family History  Problem Relation Age of Onset  . Diabetes Father   . Hypertension Father   . Other Neg Hx    No hereditary disease.  No cancer of breast, ovary, uterus. No cutaneous leiomyomatosis or renal cell carcinoma.  Social History   Social History  . Marital Status: Married    Spouse Name: N/A  . Number of Children: N/A  . Years of Education: N/A   Occupational History  . Not on file.   Social History Main Topics  . Smoking status: Never Smoker   . Smokeless tobacco: Never Used  . Alcohol Use: No  . Drug Use: No  . Sexual Activity: Not on file   Other Topics Concern  . Not on file   Social History Narrative    No Known Allergies  No current facility-administered medications on file prior to encounter.   Current Outpatient Prescriptions on File Prior to Encounter  Medication Sig Dispense Refill  . labetalol (NORMODYNE) 200 MG tablet Take 1 tablet (200 mg total) by mouth 2 (two) times daily. 60 tablet  3  . Prenatal Vit-Fe Fumarate-FA (PRENATAL MULTIVITAMIN) TABS Take 1 tablet by mouth at bedtime.       Review of Systems  Constitutional: Negative.   HENT: Negative.   Eyes: Negative.   Respiratory: Negative.   Cardiovascular: Negative.   Gastrointestinal: Negative.   Genitourinary: Negative.   Musculoskeletal: Negative.   Skin: Negative.   Neurological: Negative.   Endo/Heme/Allergies: Negative.   Psychiatric/Behavioral: Negative.      Physical Exam  Ht 5\' 3"  (1.6 m)  Wt 90.719 kg (200 lb)  BMI 35.44 kg/m2  LMP 09/20/2015 (Exact Date) Constitutional: She is oriented to person, place, and time. She appears well-developed and well-nourished.  HENT:  Head: Normocephalic and atraumatic.  Nose: Nose normal.  Mouth/Throat: Oropharynx is clear and moist. No oropharyngeal exudate.  Eyes: Conjunctivae normal and EOM are normal. Pupils are equal, round, and reactive to light. No scleral icterus.  Neck: Normal range of motion. Neck supple. No tracheal deviation present. No thyromegaly present.  Cardiovascular: Normal rate.   Respiratory: Effort normal and breath sounds normal.  GI: Soft. Bowel sounds are normal. She exhibits no distension and no mass. There is no tenderness.  Lymphadenopathy:    She has no cervical adenopathy.  Neurological: She is alert and oriented to person, place, and time. She  has normal reflexes.  Skin: Skin is warm.  Psychiatric: She has a normal mood and affect. Her behavior is normal. Judgment and thought content normal.       Assessment/Plan:  Laparoscopy, bilateral salpingo-ovariolysis followed by ovulation induction.

## 2015-10-11 NOTE — Anesthesia Preprocedure Evaluation (Signed)
Anesthesia Evaluation  Patient identified by MRN, date of birth, ID band Patient awake    Reviewed: Allergy & Precautions, NPO status , Patient's Chart, lab work & pertinent test results  Airway Mallampati: I  TM Distance: >3 FB Neck ROM: Full    Dental   Pulmonary    Pulmonary exam normal        Cardiovascular hypertension, Pt. on medications Normal cardiovascular exam     Neuro/Psych    GI/Hepatic   Endo/Other    Renal/GU      Musculoskeletal   Abdominal   Peds  Hematology   Anesthesia Other Findings   Reproductive/Obstetrics                             Anesthesia Physical Anesthesia Plan  ASA: II  Anesthesia Plan: General   Post-op Pain Management:    Induction: Intravenous  Airway Management Planned: Oral ETT  Additional Equipment:   Intra-op Plan:   Post-operative Plan: Extubation in OR  Informed Consent: I have reviewed the patients History and Physical, chart, labs and discussed the procedure including the risks, benefits and alternatives for the proposed anesthesia with the patient or authorized representative who has indicated his/her understanding and acceptance.     Plan Discussed with: CRNA and Surgeon  Anesthesia Plan Comments:         Anesthesia Quick Evaluation  

## 2015-10-11 NOTE — Anesthesia Procedure Notes (Signed)
Procedure Name: Intubation Date/Time: 10/11/2015 3:50 PM Performed by: Denna Haggard D Pre-anesthesia Checklist: Patient identified, Emergency Drugs available, Suction available and Patient being monitored Patient Re-evaluated:Patient Re-evaluated prior to inductionOxygen Delivery Method: Circle System Utilized Preoxygenation: Pre-oxygenation with 100% oxygen Intubation Type: IV induction Ventilation: Mask ventilation without difficulty Laryngoscope Size: Mac and 3 Grade View: Grade I Tube type: Oral Tube size: 7.0 mm Number of attempts: 1 Airway Equipment and Method: Stylet and Oral airway Placement Confirmation: ETT inserted through vocal cords under direct vision,  positive ETCO2 and breath sounds checked- equal and bilateral Secured at: 22 cm Tube secured with: Tape Dental Injury: Teeth and Oropharynx as per pre-operative assessment

## 2015-10-12 ENCOUNTER — Encounter (HOSPITAL_BASED_OUTPATIENT_CLINIC_OR_DEPARTMENT_OTHER): Payer: Self-pay | Admitting: Obstetrics and Gynecology

## 2016-08-30 IMAGING — US US MFM OB TRANSVAGINAL
1 series · 15 of 24 positions shown · non-contrast
Comparison: none

[Series 1: us mfm ob transvaginal · 24 acquisitions, 15 frames shown]
[im 1/24]
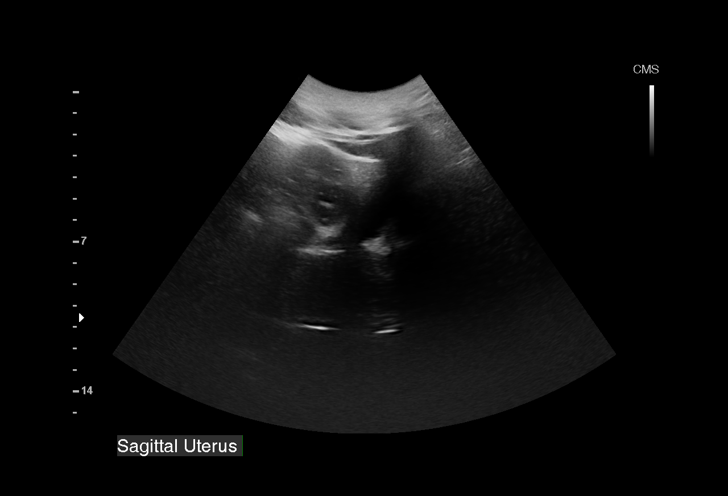
[im 3/24]
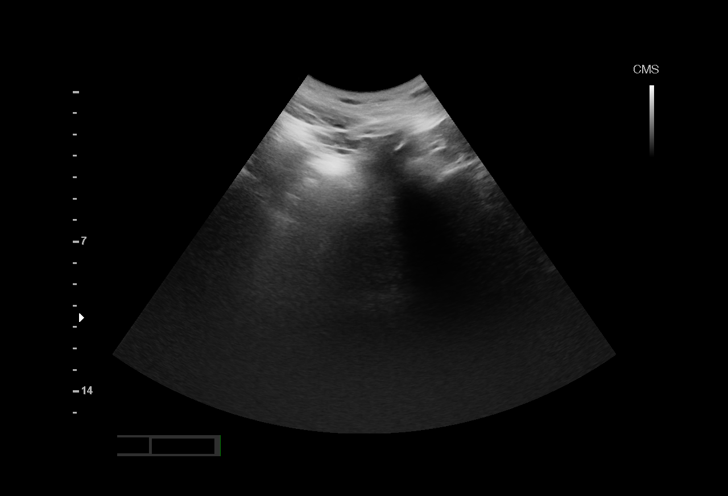
[im 5/24]
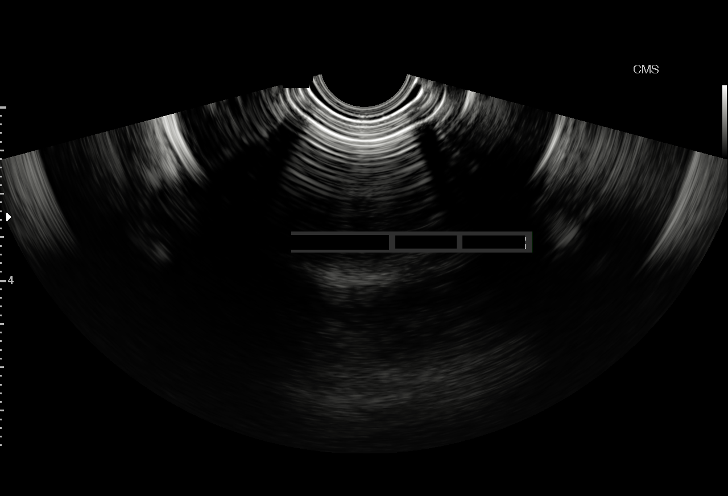
[im 6/24]
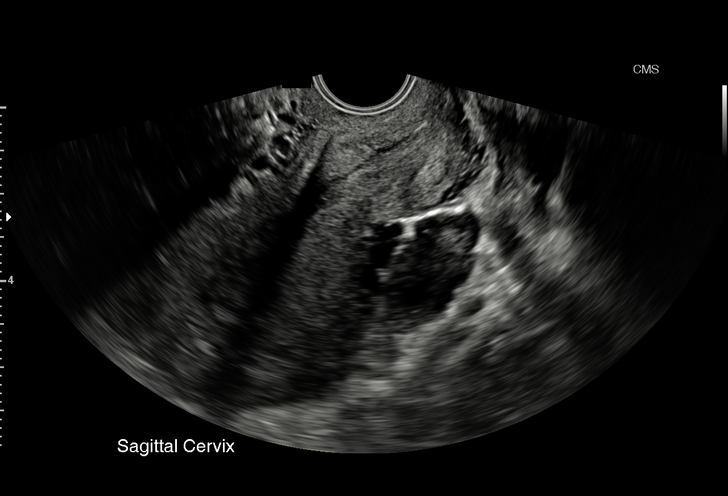
[im 8/24]
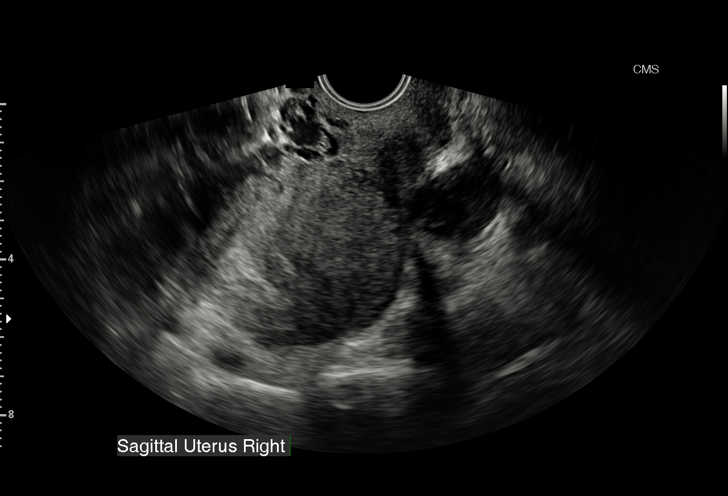
[im 9/24]
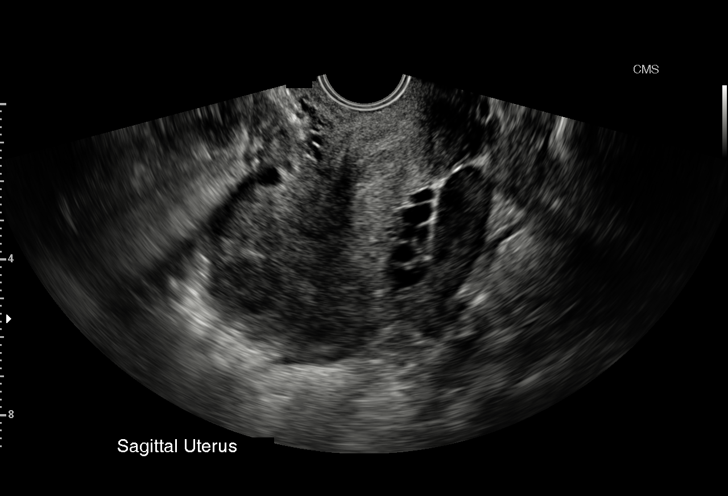
[im 11/24]
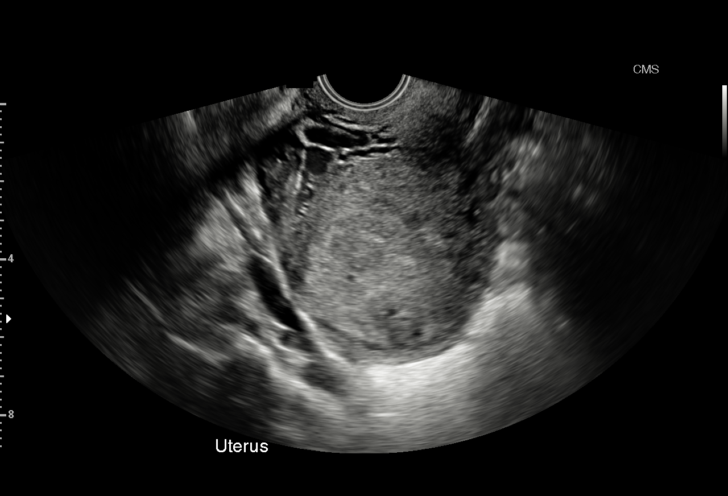
[im 13/24]
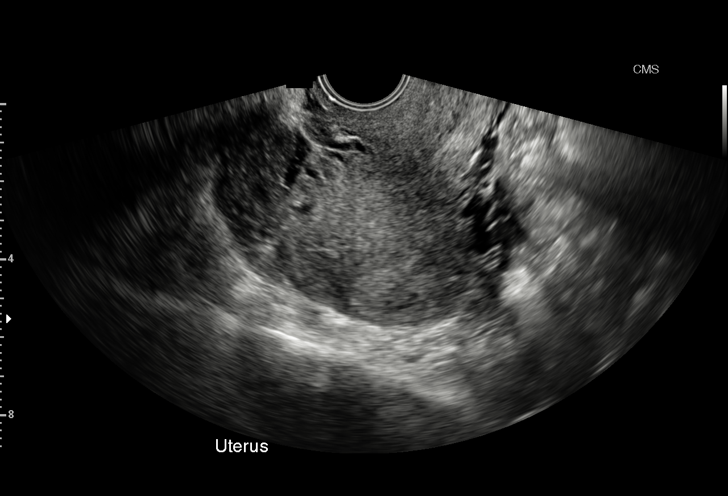
[im 14/24]
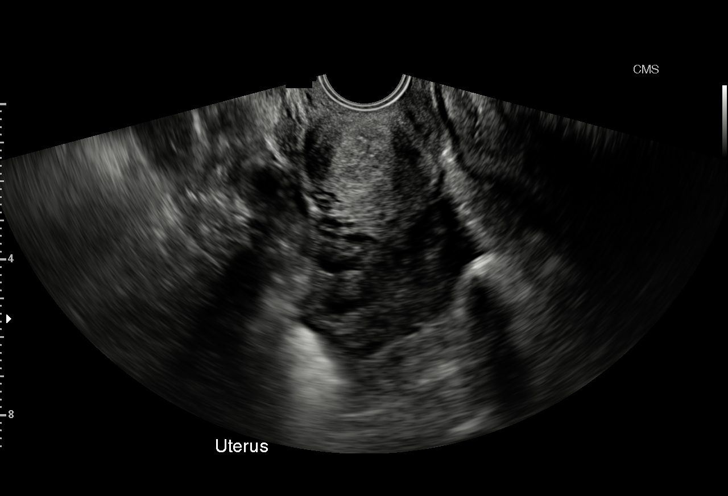
[im 16/24]
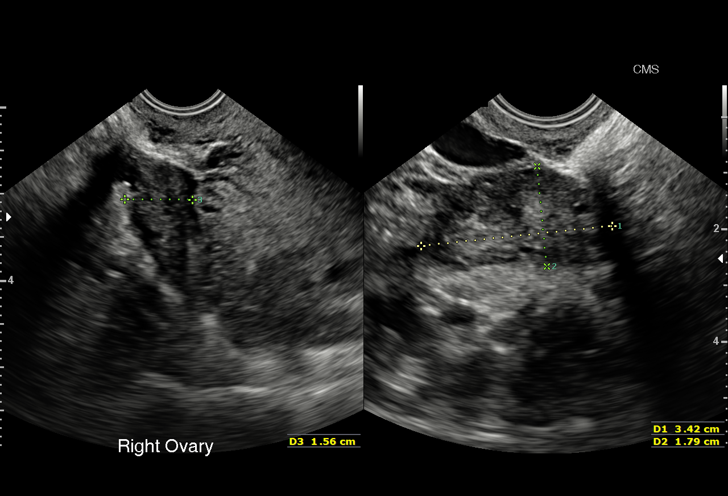
[im 17/24]
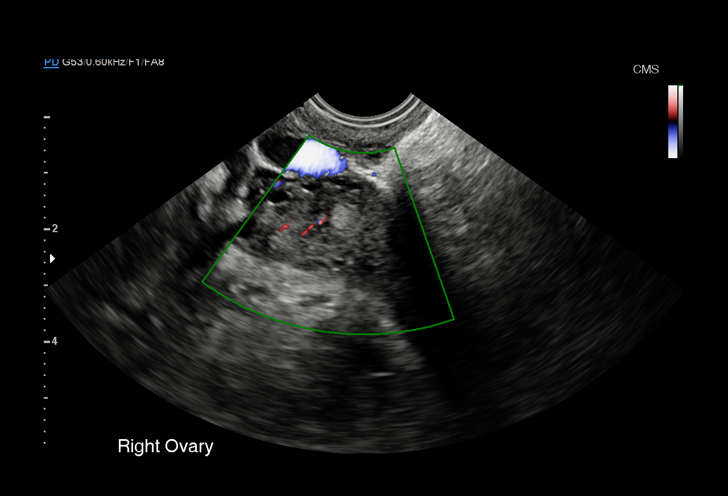
[im 19/24]
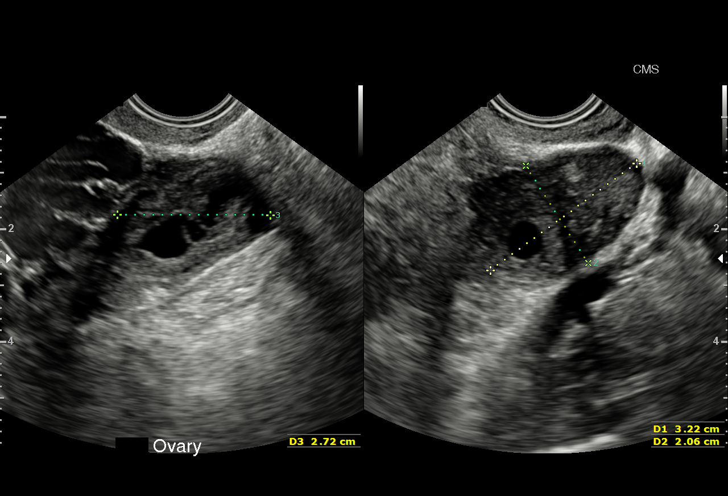
[im 21/24]
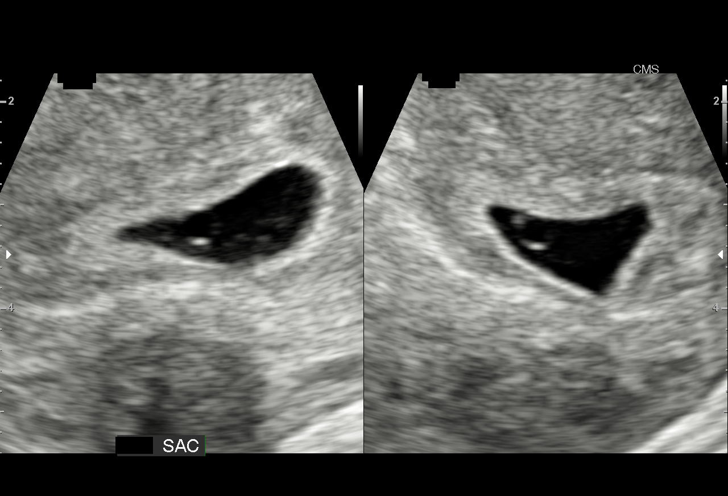
[im 22/24]
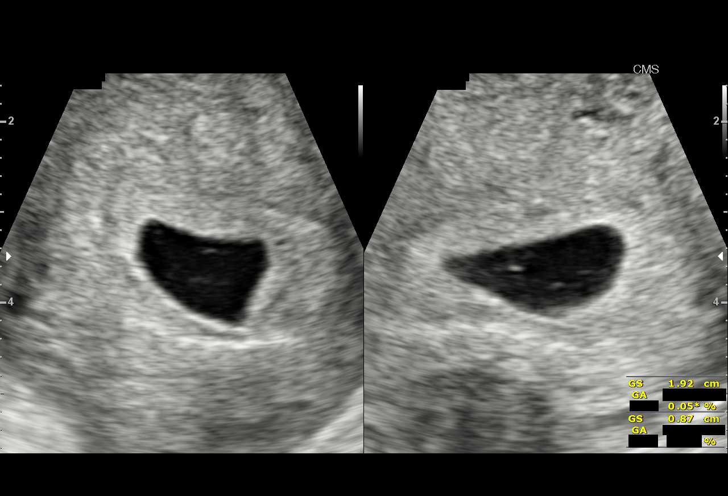
[im 24/24]
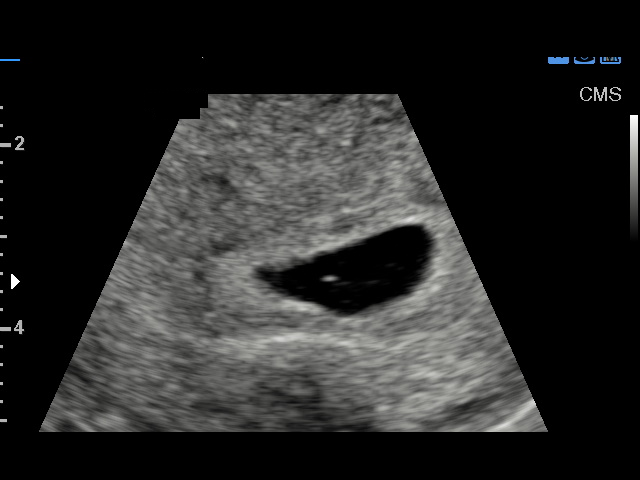

[15 of 24 positions shown; findings below may reference images not displayed]

OBSTETRICS REPORT
(Corrected Final 04/13/2015 [DATE])

Service(s) Provided

US MFM OB COMP LESS THAN 14 WEEKS                     76801.4
US MFM OB TRANSVAGINAL                                76817.2
Indications

9 weeks gestation of pregnancy
Advanced maternal age multigravida (39), first
trimester
Hypertension - Chronic/Pre-existing
Pregnancy with inconclusive fetal viability
Encounter for antenatal screening of mother           Z36
Fetal Evaluation

Num Of Fetuses:    1
Preg. Location:    Intrauterine
Gest. Sac:         Intrauterine
Yolk Sac:          Visualized
Fetal Pole:        Not visualized
Cardiac Activity:  Not visualized
Biometry

GS:      14.7  mm     G. Age:  6w 3d                  EDD:    12/03/15
Gestational Age

LMP:           9w 4d         Date:  02/04/15                 EDD:   11/11/15
Best:          9w 4d      Det. By:  LMP  (02/04/15)          EDD:   11/11/15
Cervix Uterus Adnexa

Cervix:       Closed.
Uterus:       No abnormality visualized.
Cul De Sac:   No free fluid seen.
Left Ovary:    Within normal limits.
Right Ovary:   Within normal limits.
Adnexa:     No abnormality visualized.
Comments

Although the utlrasound findings today are suspicious for
early pregnancy failure this cannot be definitively diagnosed
with a mean sac diameter of 15 mm.  A repeat ultrasound has
been ordered in 2 weeks to reassess pregnancy viability.
Impression

Intrauterine pregnancy at 9 weeks 4 days by LMP.
Mean sac diameter of 15 mm.
No fetal pole or yolk sac seen.
Recommendations

Recommend follow-up ultrasound examination in 2 weeks. to
reassess pregnancy viability.

Attending Physician, [email or web address]

## 2016-09-27 DIAGNOSIS — D509 Iron deficiency anemia, unspecified: Secondary | ICD-10-CM | POA: Insufficient documentation

## 2018-04-23 DIAGNOSIS — M62830 Muscle spasm of back: Secondary | ICD-10-CM | POA: Insufficient documentation

## 2018-04-23 DIAGNOSIS — M545 Low back pain, unspecified: Secondary | ICD-10-CM | POA: Insufficient documentation

## 2018-07-31 ENCOUNTER — Other Ambulatory Visit: Payer: Self-pay | Admitting: Endocrinology

## 2018-07-31 DIAGNOSIS — E049 Nontoxic goiter, unspecified: Secondary | ICD-10-CM

## 2018-08-04 ENCOUNTER — Other Ambulatory Visit: Payer: BLUE CROSS/BLUE SHIELD

## 2018-08-07 ENCOUNTER — Other Ambulatory Visit: Payer: Self-pay | Admitting: Endocrinology

## 2018-08-07 DIAGNOSIS — E221 Hyperprolactinemia: Secondary | ICD-10-CM

## 2018-08-13 DIAGNOSIS — E221 Hyperprolactinemia: Secondary | ICD-10-CM | POA: Insufficient documentation

## 2018-09-24 ENCOUNTER — Ambulatory Visit
Admission: RE | Admit: 2018-09-24 | Discharge: 2018-09-24 | Disposition: A | Discharge status: Home / Self Care | Payer: BLUE CROSS/BLUE SHIELD | Source: Ambulatory Visit | Attending: Endocrinology | Admitting: Endocrinology

## 2018-09-24 ENCOUNTER — Other Ambulatory Visit: Payer: Self-pay

## 2018-09-24 DIAGNOSIS — E221 Hyperprolactinemia: Secondary | ICD-10-CM

## 2018-09-24 MED ORDER — GADOBENATE DIMEGLUMINE 529 MG/ML IV SOLN
10.0000 mL | Freq: Once | INTRAVENOUS | Status: AC | PRN
  Administered 2018-09-24: 10 mL via INTRAVENOUS

## 2018-12-04 ENCOUNTER — Ambulatory Visit: Payer: BC Managed Care – PPO | Admitting: Obstetrics & Gynecology

## 2018-12-04 ENCOUNTER — Encounter: Payer: Self-pay | Admitting: Obstetrics & Gynecology

## 2018-12-04 ENCOUNTER — Other Ambulatory Visit: Payer: Self-pay

## 2018-12-04 VITALS — BP 140/90 | Ht 64.0 in | Wt 219.0 lb

## 2018-12-04 DIAGNOSIS — Z3009 Encounter for other general counseling and advice on contraception: Secondary | ICD-10-CM | POA: Diagnosis not present

## 2018-12-04 DIAGNOSIS — Z01419 Encounter for gynecological examination (general) (routine) without abnormal findings: Secondary | ICD-10-CM

## 2018-12-04 DIAGNOSIS — Z6837 Body mass index (BMI) 37.0-37.9, adult: Secondary | ICD-10-CM

## 2018-12-04 DIAGNOSIS — Z1151 Encounter for screening for human papillomavirus (HPV): Secondary | ICD-10-CM | POA: Diagnosis not present

## 2018-12-04 DIAGNOSIS — E6609 Other obesity due to excess calories: Secondary | ICD-10-CM

## 2018-12-04 NOTE — Progress Notes (Signed)
Jamie Mcbride 1975/10/21 [id number]   History:    43 y.o. G2P0A2 Single  RP:  New patient presenting for annual gyn exam   HPI: Menstrual.'s every month with normal flow.  No breakthrough bleeding.  No pelvic pain.  Abstinent.  Urine and bowel movements normal.  Breasts normal.  Body mass index 37.59.  Walking.  Follow-up here for fasting health labs.  Past medical history,surgical history, family history and social history were all reviewed and documented in the EPIC chart.  Gynecologic History Patient's last menstrual period was 11/24/2018. Contraception: abstinence Last Pap: 03/2015. Results were: Negative Last mammogram: 2018. Results were: normal per patient Bone Density: Never Colonoscopy: Never  Obstetric History OB History  Gravida Para Term Preterm AB Living  2 0 0 0 1 0  SAB TAB Ectopic Multiple Live Births  1 0 0 0      # Outcome Date GA Lbr Len/2nd Weight Sex Delivery Anes PTL Lv  2 Gravida           1 SAB 2013 [redacted]w[redacted]d   SAB        Birth Comments: System Generated. Please review and update pregnancy details.     ROS: A ROS was performed and pertinent positives and negatives are included in the history.  GENERAL: No fevers or chills. HEENT: No change in vision, no earache, sore throat or sinus congestion. NECK: No pain or stiffness. CARDIOVASCULAR: No chest pain or pressure. No palpitations. PULMONARY: No shortness of breath, cough or wheeze. GASTROINTESTINAL: No abdominal pain, nausea, vomiting or diarrhea, melena or bright red blood per rectum. GENITOURINARY: No urinary frequency, urgency, hesitancy or dysuria. MUSCULOSKELETAL: No joint or muscle pain, no back pain, no recent trauma. DERMATOLOGIC: No rash, no itching, no lesions. ENDOCRINE: No polyuria, polydipsia, no heat or cold intolerance. No recent change in weight. HEMATOLOGICAL: No anemia or easy bruising or bleeding. NEUROLOGIC: No headache, seizures, numbness, tingling or weakness. PSYCHIATRIC: No  depression, no loss of interest in normal activity or change in sleep pattern.     Exam:   BP 140/90   Ht '5\' 4"'  (1.626 m)   Wt 219 lb (99.3 kg)   LMP 11/24/2018   BMI 37.59 kg/m   Body mass index is 37.59 kg/m.  General appearance : Well developed well nourished female. No acute distress HEENT: Eyes: no retinal hemorrhage or exudates,  Neck supple, trachea midline, no carotid bruits, no thyroidmegaly Lungs: Clear to auscultation, no rhonchi or wheezes, or rib retractions  Heart: Regular rate and rhythm, no murmurs or gallops Breast:Examined in sitting and supine position were symmetrical in appearance, no palpable masses or tenderness,  no skin retraction, no nipple inversion, no nipple discharge, no skin discoloration, no axillary or supraclavicular lymphadenopathy Abdomen: no palpable masses or tenderness, no rebound or guarding Extremities: no edema or skin discoloration or tenderness  Pelvic: Vulva: Normal             Vagina: No gross lesions or discharge  Cervix: No gross lesions or discharge.  Pap/HPV HR done.  Uterus  RV, normal size, shape and consistency, non-tender and mobile  Adnexa  Without masses or tenderness  Anus: Normal   Assessment/Plan:  43y.o. female for annual exam   1. Encounter for routine gynecological examination with Papanicolaou smear of cervix Normal gynecologic exam.  Pap with high-risk HPV done today.  Breast exam normal.  Will schedule a screening mammogram at the breast center.  Follow-up here for fasting health labs. -  CBC; Future - Comp Met (CMET); Future - TSH; Future - VITAMIN D 25 Hydroxy (Vit-D Deficiency, Fractures); Future - Lipid panel; Future  2. Encounter for other general counseling or advice on contraception Currently abstinent.  Will use condoms as needed.  3. Class 2 obesity due to excess calories without serious comorbidity with body mass index (BMI) of 37.0 to 37.9 in adult Recommend a lower calorie/carb diet such as  [employer].  Aerobic physical activities 5 times a week and weightlifting every 2 days.  Other orders - amLODipine (NORVASC) 5 MG tablet; Take 5 mg by mouth daily. - PRESCRIPTION MEDICATION; Name? - hormones  Princess Bruins MD, 2:09 PM 12/04/2018

## 2018-12-04 NOTE — Patient Instructions (Signed)
1. Encounter for routine gynecological examination with Papanicolaou smear of cervix Normal gynecologic exam.  Pap with high-risk HPV done today.  Breast exam normal.  Will schedule a screening mammogram at the breast center.  Follow-up here for fasting health labs. - CBC; Future - Comp Met (CMET); Future - TSH; Future - VITAMIN D 25 Hydroxy (Vit-D Deficiency, Fractures); Future - Lipid panel; Future  2. Encounter for other general counseling or advice on contraception Currently abstinent.  Will use condoms as needed.  3. Class 2 obesity due to excess calories without serious comorbidity with body mass index (BMI) of 37.0 to 37.9 in adult Recommend a lower calorie/carb diet such as [employer].  Aerobic physical activities 5 times a week and weightlifting every 2 days.  Other orders - amLODipine (NORVASC) 5 MG tablet; Take 5 mg by mouth daily. - PRESCRIPTION MEDICATION; Name? - hormones  Jamie Mcbride, it was a pleasure meeting you today!  I will inform you of your results as soon as they are available.

## 2018-12-04 NOTE — Addendum Note (Signed)
Addended by: Thurnell Garbe A on: 12/04/2018 02:49 PM   Modules accepted: Orders

## 2018-12-08 LAB — PAP, TP IMAGING W/ HPV RNA, RFLX HPV TYPE 16,18/45: HPV DNA High Risk: NOT DETECTED

## 2018-12-15 ENCOUNTER — Other Ambulatory Visit: Payer: Self-pay | Admitting: Obstetrics & Gynecology

## 2018-12-15 DIAGNOSIS — Z1231 Encounter for screening mammogram for malignant neoplasm of breast: Secondary | ICD-10-CM

## 2018-12-26 ENCOUNTER — Other Ambulatory Visit: Payer: Self-pay

## 2018-12-26 ENCOUNTER — Other Ambulatory Visit: Payer: BC Managed Care – PPO

## 2018-12-26 DIAGNOSIS — Z01419 Encounter for gynecological examination (general) (routine) without abnormal findings: Secondary | ICD-10-CM

## 2018-12-26 DIAGNOSIS — D696 Thrombocytopenia, unspecified: Secondary | ICD-10-CM | POA: Insufficient documentation

## 2018-12-27 LAB — LIPID PANEL
Cholesterol: 158 mg/dL (ref <200)
HDL: 69 mg/dL (ref >=50)
LDL Cholesterol (Calc): 79 mg/dL (calc)
Non-HDL Cholesterol (Calc): 89 mg/dL (calc) (ref <130)
Total CHOL/HDL Ratio: 2.3 (calc) (ref <5.0)
Triglycerides: 35 mg/dL (ref <150)

## 2018-12-27 LAB — CBC
HCT: 38.5 % (ref 35.0–45.0)
Hemoglobin: 11.5 g/dL — ABNORMAL LOW (ref 11.7–15.5)
MCH: 20.8 pg — ABNORMAL LOW (ref 27.0–33.0)
MCHC: 29.9 g/dL — ABNORMAL LOW (ref 32.0–36.0)
MCV: 69.7 fL — ABNORMAL LOW (ref 80.0–100.0)
Platelets: 107 10*3/uL — ABNORMAL LOW (ref 140–400)
RBC: 5.52 10*6/uL — ABNORMAL HIGH (ref 3.80–5.10)
RDW: 15.2 % — ABNORMAL HIGH (ref 11.0–15.0)
WBC: 3 10*3/uL — ABNORMAL LOW (ref 3.8–10.8)

## 2018-12-27 LAB — COMPREHENSIVE METABOLIC PANEL
AG Ratio: 1.9 (calc) (ref 1.0–2.5)
ALT: 10 U/L (ref 6–29)
AST: 15 U/L (ref 10–30)
Albumin: 4.2 g/dL (ref 3.6–5.1)
Alkaline phosphatase (APISO): 40 U/L (ref 31–125)
BUN: 11 mg/dL (ref 7–25)
CO2: 23 mmol/L (ref 20–32)
Calcium: 9 mg/dL (ref 8.6–10.2)
Chloride: 110 mmol/L (ref 98–110)
Creat: 0.7 mg/dL (ref 0.50–1.10)
Globulin: 2.2 g/dL (calc) (ref 1.9–3.7)
Glucose, Bld: 96 mg/dL (ref 65–99)
Potassium: 3.7 mmol/L (ref 3.5–5.3)
Sodium: 142 mmol/L (ref 135–146)
Total Bilirubin: 0.5 mg/dL (ref 0.2–1.2)
Total Protein: 6.4 g/dL (ref 6.1–8.1)

## 2018-12-27 LAB — VITAMIN D 25 HYDROXY (VIT D DEFICIENCY, FRACTURES): Vit D, 25-Hydroxy: 22 ng/mL — ABNORMAL LOW (ref 30–100)

## 2018-12-27 LAB — TSH: TSH: 2.49 mIU/L

## 2018-12-30 ENCOUNTER — Other Ambulatory Visit: Payer: Self-pay

## 2018-12-30 DIAGNOSIS — D708 Other neutropenia: Secondary | ICD-10-CM

## 2018-12-30 DIAGNOSIS — E559 Vitamin D deficiency, unspecified: Secondary | ICD-10-CM

## 2018-12-30 MED ORDER — VITAMIN D (ERGOCALCIFEROL) 1.25 MG (50000 UNIT) PO CAPS: 50000.0000 [IU] | ORAL_CAPSULE | ORAL | 0 refills | Status: AC

## 2019-01-29 ENCOUNTER — Other Ambulatory Visit: Payer: Self-pay

## 2019-01-29 ENCOUNTER — Other Ambulatory Visit: Payer: BC Managed Care – PPO

## 2019-01-29 ENCOUNTER — Ambulatory Visit
Admission: RE | Admit: 2019-01-29 | Discharge: 2019-01-29 | Disposition: A | Discharge status: Home / Self Care | Payer: BLUE CROSS/BLUE SHIELD | Source: Ambulatory Visit | Attending: Obstetrics & Gynecology | Admitting: Obstetrics & Gynecology

## 2019-01-29 DIAGNOSIS — D708 Other neutropenia: Secondary | ICD-10-CM

## 2019-01-29 DIAGNOSIS — Z1231 Encounter for screening mammogram for malignant neoplasm of breast: Secondary | ICD-10-CM

## 2019-01-29 LAB — CBC WITH DIFFERENTIAL/PLATELET
Absolute Monocytes: 239 cells/uL (ref 200–950)
Basophils Absolute: 19 cells/uL (ref 0–200)
Basophils Relative: 0.6 %
Eosinophils Absolute: 19 cells/uL (ref 15–500)
Eosinophils Relative: 0.6 %
HCT: 40.7 % (ref 35.0–45.0)
Hemoglobin: 12 g/dL (ref 11.7–15.5)
Lymphs Abs: 1435 cells/uL (ref 850–3900)
MCH: 20.8 pg — ABNORMAL LOW (ref 27.0–33.0)
MCHC: 29.5 g/dL — ABNORMAL LOW (ref 32.0–36.0)
MCV: 70.5 fL — ABNORMAL LOW (ref 80.0–100.0)
Monocytes Relative: 7.7 %
Neutro Abs: 1389 cells/uL — ABNORMAL LOW (ref 1500–7800)
Neutrophils Relative %: 44.8 %
Platelets: 179 10*3/uL (ref 140–400)
RBC: 5.77 10*6/uL — ABNORMAL HIGH (ref 3.80–5.10)
RDW: 15.2 % — ABNORMAL HIGH (ref 11.0–15.0)
Total Lymphocyte: 46.3 %
WBC: 3.1 10*3/uL — ABNORMAL LOW (ref 3.8–10.8)

## 2019-02-02 ENCOUNTER — Other Ambulatory Visit: Payer: Self-pay | Admitting: Obstetrics & Gynecology

## 2019-02-02 DIAGNOSIS — R928 Other abnormal and inconclusive findings on diagnostic imaging of breast: Secondary | ICD-10-CM

## 2019-03-12 ENCOUNTER — Other Ambulatory Visit: Payer: Self-pay

## 2019-03-12 DIAGNOSIS — Z20822 Contact with and (suspected) exposure to covid-19: Secondary | ICD-10-CM

## 2019-03-12 DIAGNOSIS — R5383 Other fatigue: Secondary | ICD-10-CM | POA: Insufficient documentation

## 2019-03-15 LAB — NOVEL CORONAVIRUS, NAA: SARS-CoV-2, NAA: NOT DETECTED

## 2019-03-19 ENCOUNTER — Other Ambulatory Visit: Payer: Self-pay | Admitting: Obstetrics & Gynecology

## 2019-06-26 ENCOUNTER — Ambulatory Visit: Payer: Self-pay | Attending: Internal Medicine

## 2019-06-26 DIAGNOSIS — Z20822 Contact with and (suspected) exposure to covid-19: Secondary | ICD-10-CM | POA: Insufficient documentation

## 2019-06-27 LAB — NOVEL CORONAVIRUS, NAA: SARS-CoV-2, NAA: NOT DETECTED

## 2019-07-27 ENCOUNTER — Ambulatory Visit: Payer: Self-pay | Attending: Internal Medicine

## 2019-07-27 DIAGNOSIS — Z20822 Contact with and (suspected) exposure to covid-19: Secondary | ICD-10-CM | POA: Insufficient documentation

## 2019-07-28 LAB — NOVEL CORONAVIRUS, NAA: SARS-CoV-2, NAA: NOT DETECTED

## 2019-08-19 ENCOUNTER — Ambulatory Visit: Payer: Self-pay | Attending: Internal Medicine

## 2019-08-19 DIAGNOSIS — Z20822 Contact with and (suspected) exposure to covid-19: Secondary | ICD-10-CM

## 2019-08-20 LAB — NOVEL CORONAVIRUS, NAA: SARS-CoV-2, NAA: NOT DETECTED

## 2020-04-27 DIAGNOSIS — R739 Hyperglycemia, unspecified: Secondary | ICD-10-CM | POA: Insufficient documentation

## 2020-04-27 DIAGNOSIS — E039 Hypothyroidism, unspecified: Secondary | ICD-10-CM | POA: Insufficient documentation

## 2020-06-18 IMAGING — MG MM DIGITAL SCREENING BILAT W/ TOMO W/ CAD
6 of 10 series · 6 of 30 positions shown · non-contrast
Comparison: None.

CLINICAL DATA: Screening.

EXAM:
DIGITAL SCREENING BILATERAL MAMMOGRAM WITH TOMO AND CAD

[R MLO synth-2D (1 of 2)]
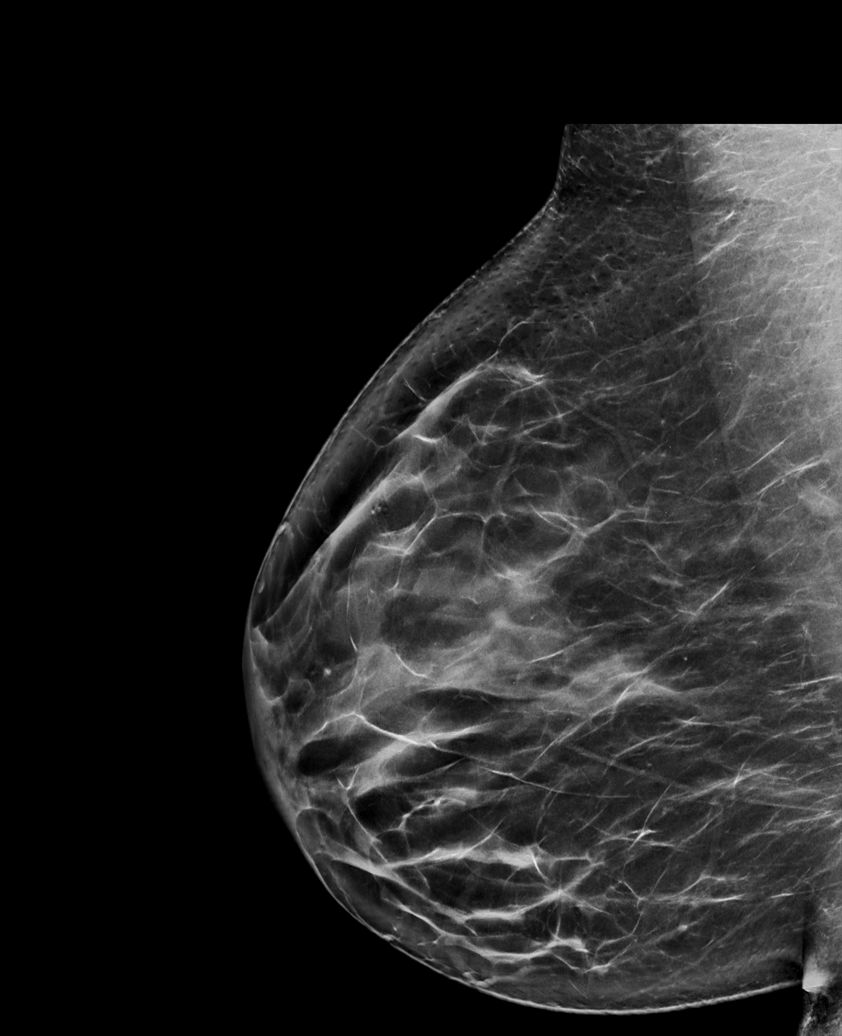

[R CC synth-2D]
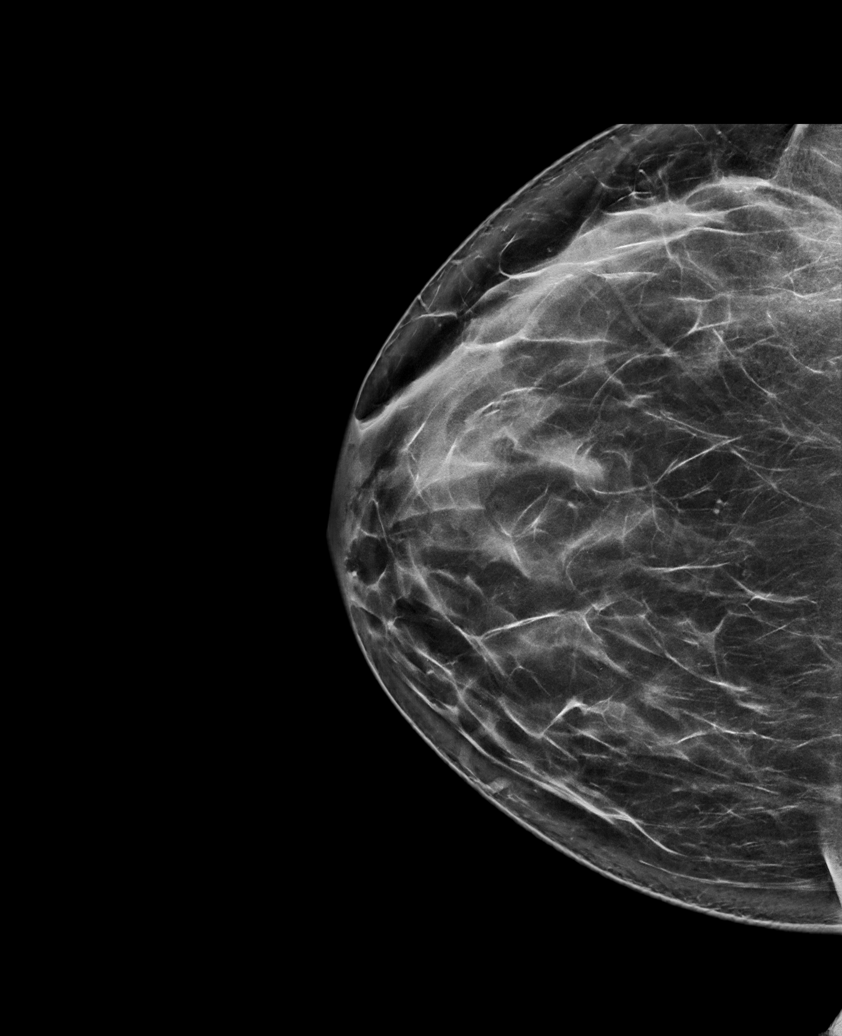

[R MLO synth-2D (2 of 2)]
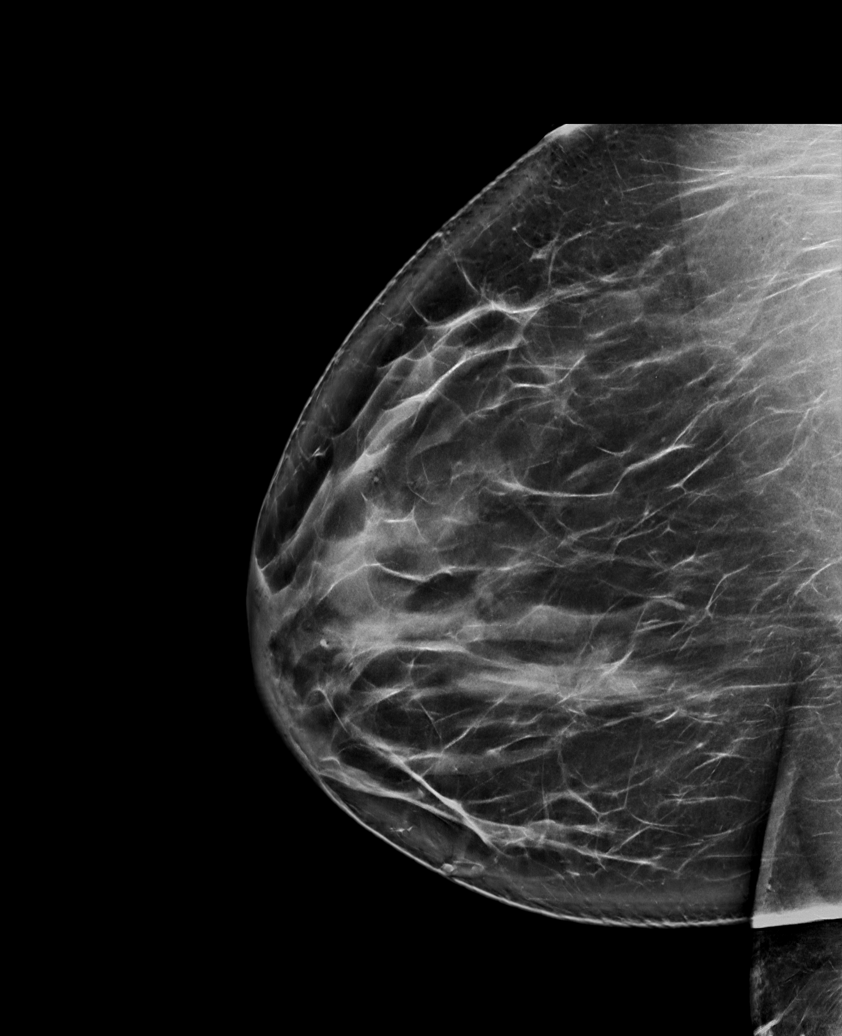

[L MLO synth-2D]
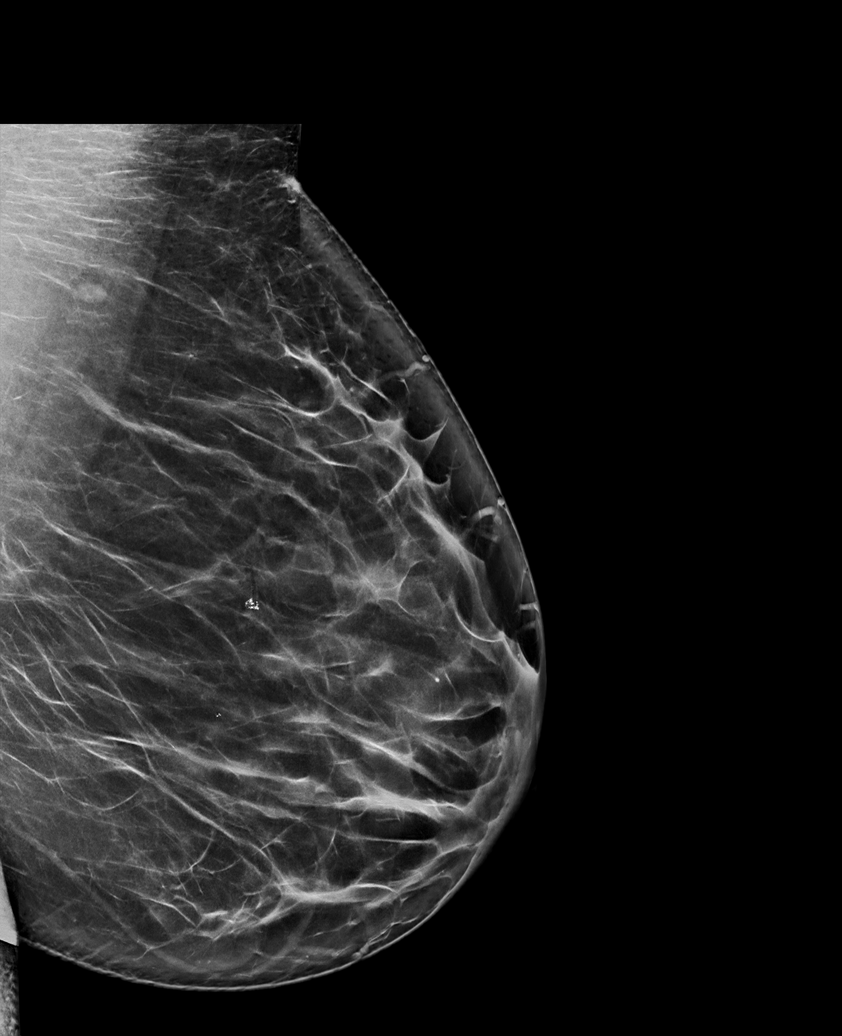

[L CC synth-2D]
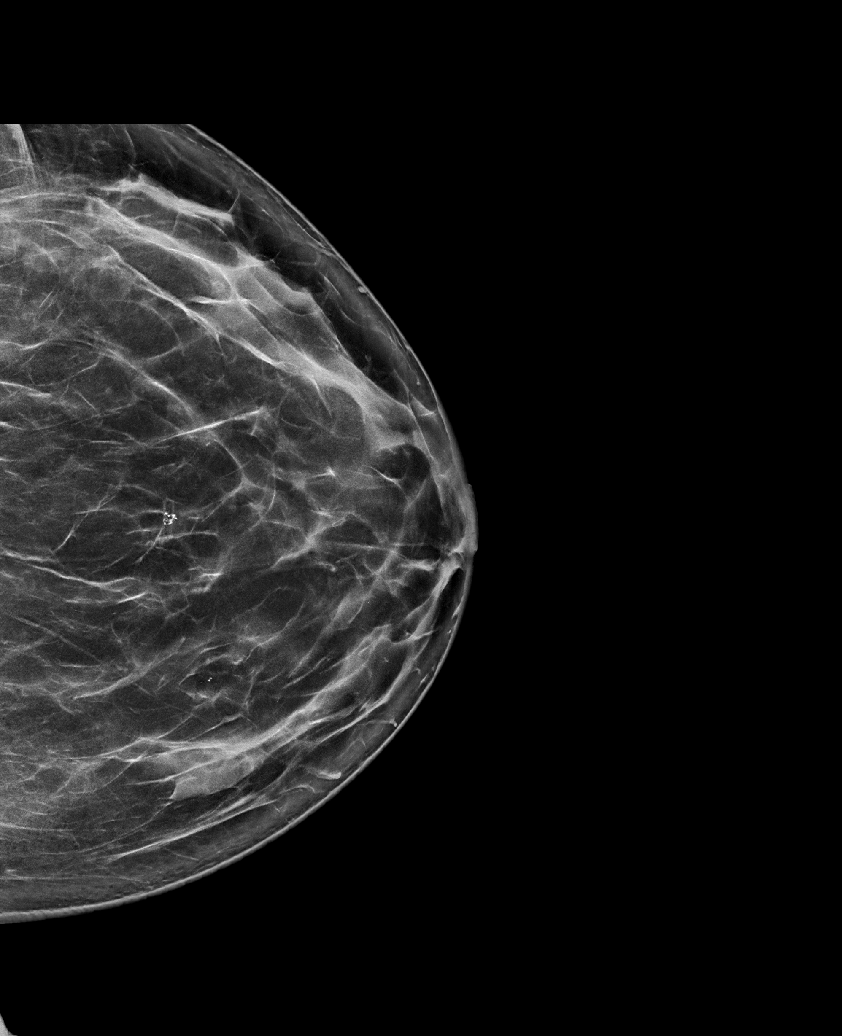

[R MLO tomo · tomo slice 50/99.0]
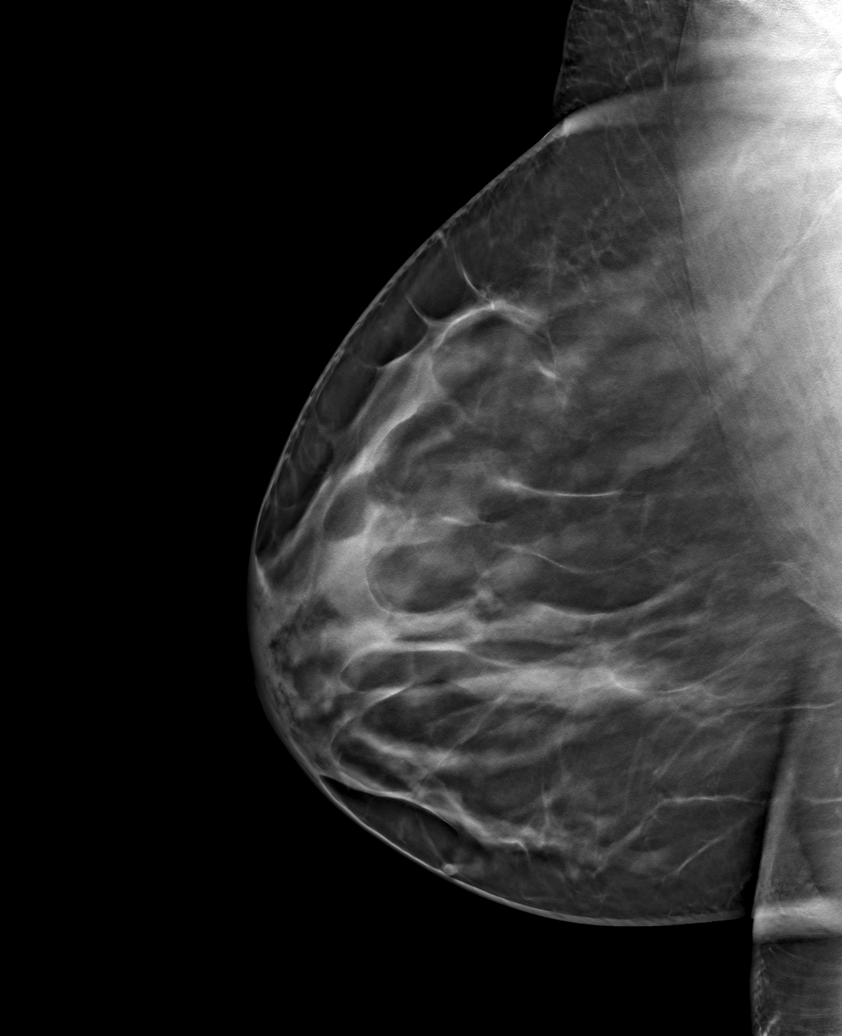

[6 of 30 positions shown; findings below may reference images not displayed]

ACR Breast Density Category c: The breast tissue is heterogeneously
dense, which may obscure small masses.
FINDINGS: In the left breast, a possible asymmetry warrants further
evaluation. In the right breast, no findings suspicious for
malignancy. Images were processed with CAD.
IMPRESSION: Further evaluation is suggested for possible asymmetry in the left
breast.

RECOMMENDATION:
Diagnostic mammogram and possibly ultrasound of the left breast.
(Code:[id number])

The patient will be contacted regarding the findings, and additional
imaging will be scheduled.

BI-RADS CATEGORY  0: Incomplete. Need additional imaging evaluation
and/or prior mammograms for comparison.

## 2022-03-04 ENCOUNTER — Ambulatory Visit
Admission: EM | Admit: 2022-03-04 | Discharge: 2022-03-04 | Disposition: A | Discharge status: Home / Self Care | Payer: Commercial Managed Care - HMO | Attending: Emergency Medicine | Admitting: Emergency Medicine

## 2022-03-04 ENCOUNTER — Encounter: Payer: Self-pay | Admitting: Emergency Medicine

## 2022-03-04 ENCOUNTER — Other Ambulatory Visit: Payer: Self-pay

## 2022-03-04 DIAGNOSIS — J069 Acute upper respiratory infection, unspecified: Secondary | ICD-10-CM | POA: Insufficient documentation

## 2022-03-04 DIAGNOSIS — Z79899 Other long term (current) drug therapy: Secondary | ICD-10-CM | POA: Diagnosis not present

## 2022-03-04 DIAGNOSIS — Z1152 Encounter for screening for COVID-19: Secondary | ICD-10-CM | POA: Diagnosis not present

## 2022-03-04 DIAGNOSIS — J029 Acute pharyngitis, unspecified: Secondary | ICD-10-CM | POA: Diagnosis present

## 2022-03-04 LAB — POCT RAPID STREP A (OFFICE): Rapid Strep A Screen: NEGATIVE

## 2022-03-04 MED ORDER — IBUPROFEN 600 MG PO TABS: 600.0000 mg | ORAL_TABLET | Freq: Three times a day (TID) | ORAL | 0 refills | Status: DC | PRN

## 2022-03-04 MED ORDER — LIDOCAINE VISCOUS HCL 2 % MT SOLN: 15.0000 mL | OROMUCOSAL | 0 refills | Status: DC | PRN

## 2022-03-04 NOTE — ED Provider Notes (Signed)
[facility]    CSN: [id number] Arrival date & time: 03/04/22  0973    HISTORY   Chief Complaint  Patient presents with   Sore Throat   HPI Jamie Mcbride is a pleasant, 46 y.o. female who presents to urgent care today. Pt complains of an acute onset of sore throat and painful swallowing 3 days ago; pt reports possible fever yesterday.  Patient is afebrile at this time but has significantly elevated blood pressure on arrival.  Patient states she did not take her amlodipine this morning.  The history is provided by the patient.  Sore Throat This is a new problem. The current episode started more than 2 days ago. The problem occurs constantly. The problem has not changed since onset.Pertinent negatives include no chest pain, no abdominal pain, no headaches and no shortness of breath. The symptoms are aggravated by swallowing. She has tried nothing for the symptoms.   Past Medical History:  Diagnosis Date   Elevated prolactin level    DX 2011   History of concussion    07-17-2013 W/ LOC (NEGATIVE CT)  NO RESIDUAL   Hypertension    Pelvic adhesions    Patient Active Problem List   Diagnosis Date Noted   Supervision of high-risk pregnancy of elderly primigravida (>=47 years old at delivery) 04/01/2015   Disorder of ovulation 07/13/2013   Supervision of pregnancy with history of infertility 11/26/2011   Infertility associated with anovulation 07/18/2011   Past Surgical History:  Procedure Laterality Date   APPENDECTOMY  2001   LAPAROSCOPY N/A 10/11/2015   Procedure: LAPROSCOPIC LYSIS OF ADHESION WITH LYSIS AND EXCISION OF RIGHT OVARY ADHESION;  Surgeon: Governor Specking, MD;  Location: [place];  Service: Gynecology;  Laterality: N/A;   OB History     Gravida  2   Para  0   Term  0   Preterm  0   AB  1   Living  0      SAB  1   IAB  0   Ectopic  0   Multiple  0   Live Births             Home Medications    Prior to  Admission medications   Medication Sig Start Date End Date Taking? Authorizing Provider  amLODipine (NORVASC) 5 MG tablet Take 5 mg by mouth daily.    [provider]  PRESCRIPTION MEDICATION Name? - hormones    [provider]  Vitamin D, Ergocalciferol, (DRISDOL) 1.25 MG (50000 UT) CAPS capsule Take 1 capsule (50,000 Units total) by mouth every 7 (seven) days. 12/30/18   Princess Bruins, MD    Family History Family History  Problem Relation Age of Onset   Diabetes Father    Hypertension Father    Other Neg Hx    Social History Social History   Tobacco Use   Smoking status: Never   Smokeless tobacco: Never  Vaping Use   Vaping Use: Never used  Substance Use Topics   Alcohol use: No   Drug use: No   Allergies   Patient has no known allergies.  Review of Systems Review of Systems  Respiratory:  Negative for shortness of breath.   Cardiovascular:  Negative for chest pain.  Gastrointestinal:  Negative for abdominal pain.  Neurological:  Negative for headaches.   Pertinent findings revealed after performing a 14 point review of systems has been noted in the history of present illness.  Physical Exam Triage  Vital Signs ED Triage Vitals  Enc Vitals Group     BP 03/17/21 0827 (!) 147/82     Pulse Rate 03/17/21 0827 72     Resp 03/17/21 0827 18     Temp 03/17/21 0827 98.3 F (36.8 C)     Temp Source 03/17/21 0827 Oral     SpO2 03/17/21 0827 98 %     Weight --      Height --      Head Circumference --      Peak Flow --      Pain Score 03/17/21 0826 5     Pain Loc --      Pain Edu? --      Excl. in [place]? --   No data found.  Updated Vital Signs BP (!) 174/101 (BP Location: Left Arm)   Pulse 67   Temp 98.2 F (36.8 C) (Oral)   Resp 18   SpO2 99%   Physical Exam Vitals and nursing note reviewed.  Constitutional:      General: She is not in acute distress.    Appearance: Normal appearance. She is not ill-appearing.  HENT:     Head:  Normocephalic and atraumatic.     Salivary Glands: Right salivary gland is not diffusely enlarged or tender. Left salivary gland is not diffusely enlarged or tender.     Right Ear: Tympanic membrane, ear canal and external ear normal. No drainage. No middle ear effusion. There is no impacted cerumen. Tympanic membrane is not erythematous or bulging.     Left Ear: Tympanic membrane, ear canal and external ear normal. No drainage.  No middle ear effusion. There is no impacted cerumen. Tympanic membrane is not erythematous or bulging.     Nose: Nose normal. No nasal deformity, septal deviation, mucosal edema, congestion or rhinorrhea.     Right Turbinates: Not enlarged, swollen or pale.     Left Turbinates: Not enlarged, swollen or pale.     Right Sinus: No maxillary sinus tenderness or frontal sinus tenderness.     Left Sinus: No maxillary sinus tenderness or frontal sinus tenderness.     Mouth/Throat:     Lips: Pink. No lesions.     Mouth: Mucous membranes are moist. No oral lesions.     Pharynx: Oropharynx is clear. Uvula midline. Posterior oropharyngeal erythema present. No pharyngeal swelling, oropharyngeal exudate or uvula swelling.     Tonsils: No tonsillar exudate. 0 on the right. 0 on the left.  Eyes:     General: Lids are normal.        Right eye: No discharge.        Left eye: No discharge.     Extraocular Movements: Extraocular movements intact.     Conjunctiva/sclera: Conjunctivae normal.     Right eye: Right conjunctiva is not injected.     Left eye: Left conjunctiva is not injected.  Neck:     Trachea: Trachea and phonation normal.  Cardiovascular:     Rate and Rhythm: Normal rate and regular rhythm.     Pulses: Normal pulses.     Heart sounds: Normal heart sounds. No murmur heard.    No friction rub. No gallop.  Pulmonary:     Effort: Pulmonary effort is normal. No accessory muscle usage, prolonged expiration or respiratory distress.     Breath sounds: Normal breath  sounds. No stridor, decreased air movement or transmitted upper airway sounds. No decreased breath sounds, wheezing, rhonchi or rales.  Chest:  Chest wall: No tenderness.  Musculoskeletal:        General: Normal range of motion.     Cervical back: Normal range of motion and neck supple. Normal range of motion.  Lymphadenopathy:     Cervical: No cervical adenopathy.  Skin:    General: Skin is warm and dry.     Findings: No erythema or rash.  Neurological:     General: No focal deficit present.     Mental Status: She is alert and oriented to person, place, and time.  Psychiatric:        Mood and Affect: Mood normal.        Behavior: Behavior normal.     Visual Acuity Right Eye Distance:   Left Eye Distance:   Bilateral Distance:    Right Eye Near:   Left Eye Near:    Bilateral Near:     [facility] Couse / Diagnostics / Procedures:     Radiology No results found.  Procedures Procedures (including critical care time) EKG  Pending results:  Labs Reviewed  SARS CORONAVIRUS 2 (TAT 6-24 HRS)  CULTURE, GROUP A STREP [facility])  POCT RAPID STREP A (OFFICE)    Medications Ordered in UC: Medications - No data to display  UC Diagnoses / Final Clinical Impressions(s)   I have reviewed the triage vital signs and the nursing notes.  Pertinent labs & imaging results that were available during my care of the patient were reviewed by me and considered in my medical decision making (see chart for details).    Final diagnoses:  Acute pharyngitis, unspecified etiology  Viral upper respiratory infection   She is requesting antibiotics today.  Patient advised that her obstructive study was negative and the culture is pending, antibiotic will be prescribed based on the results of her strep culture.  Patient further advised that antibiotics do not treat viral infections which is the most likely cause of her illness at this time.  Patient advised of supportive medications and conservative care  recommendations.  Return precautions advised.  ED Prescriptions     Medication Sig Dispense Auth. Provider   ibuprofen (ADVIL) 600 MG tablet Take 1 tablet (600 mg total) by mouth every 8 (eight) hours as needed for up to 30 doses for fever, headache, mild pain or moderate pain (Inflammation). Take 1 tablet 3 times daily as needed for inflammation of upper airways and/or pain. 30 tablet Lynden Oxford Scales, PA-C   lidocaine (XYLOCAINE) 2 % solution Use as directed 15 mLs in the mouth or throat every 3 (three) hours as needed for mouth pain (Sore throat). 300 mL Lynden Oxford Scales, PA-C      PDMP not reviewed this encounter.  Disposition Upon Discharge:  Condition: stable for discharge home Home: take medications as prescribed; routine discharge instructions as discussed; follow up as advised.  Patient presented with an acute illness with associated systemic symptoms and significant discomfort requiring urgent management. In my opinion, this is a condition that a prudent lay person (someone who possesses an average knowledge of health and medicine) may potentially expect to result in complications if not addressed urgently such as respiratory distress, impairment of bodily function or dysfunction of bodily organs.   Routine symptom specific, illness specific and/or disease specific instructions were discussed with the patient and/or caregiver at length.   As such, the patient has been evaluated and assessed, work-up was performed and treatment was provided in alignment with urgent care protocols and evidence based medicine.  Patient/parent/caregiver has  been advised that the patient may require follow up for further testing and treatment if the symptoms continue in spite of treatment, as clinically indicated and appropriate.  If the patient was tested for COVID-19, Influenza and/or RSV, then the patient/parent/guardian was advised to isolate at home pending the results of his/her  diagnostic coronavirus test and potentially longer if they're positive. I have also advised pt that if his/her COVID-19 test returns positive, it's recommended to self-isolate for at least 10 days after symptoms first appeared AND until fever-free for 24 hours without fever reducer AND other symptoms have improved or resolved. Discussed self-isolation recommendations as well as instructions for household member/close contacts as per the [facility] and  DHHS, and also gave patient the [place] packet with this information.  Patient/parent/caregiver has been advised to return to the [facility] or PCP in 3-5 days if no better; to PCP or the Emergency Department if new signs and symptoms develop, or if the current signs or symptoms continue to change or worsen for further workup, evaluation and treatment as clinically indicated and appropriate  The patient will follow up with their current PCP if and as advised. If the patient does not currently have a PCP we will assist them in obtaining one.   The patient may need specialty follow up if the symptoms continue, in spite of conservative treatment and management, for further workup, evaluation, consultation and treatment as clinically indicated and appropriate.  Patient/parent/caregiver verbalized understanding and agreement of plan as discussed.  All questions were addressed during visit.  Please see discharge instructions below for further details of plan.  Discharge Instructions:   Discharge Instructions      Your strep test today is negative.  Streptococcal throat culture will be performed per our protocol.  The result of your throat culture will be posted to your MyChart once it is complete, this typically takes 3 to 5 days.  If your streptococcal throat culture is positive, you will be contacted by phone and antibiotics will prescribed for you.   You received a COVID-19 PCR test today.  The result of your COVID-19 test will be posted to your MyChart once it is  complete, typically this takes 24 to 36 hours.  If your COVID-19 PCR test is positive, you will be contacted by phone.  Because you do not have a history of being immune compromised, you are currently vaccinated for COVID-19, you are under the age of 57, you do not have a risk of severe disease due to COVID-19, antiviral treatment is not indicated.     Based on my physical exam findings and the history you have provided  today, I do not recommend antibiotics at this time.  I do not believe the risks and side effects of antibiotics would outweigh any minimal benefit that they might provide.          Please see the list below for recommended medications, dosages and frequencies to provide relief of current symptoms:     Advil, Motrin (ibuprofen): This is a good anti-inflammatory medication which addresses aches, pains and inflammation of the upper airways that causes sinus and nasal congestion as well as in the lower airways which makes your cough feel tight and sometimes burn.  I recommend that you take between 400 to 600 mg every 6-8 hours as needed.      Tylenol (acetaminophen): This is a good fever reducer.  If your body temperature rises above 101.5 as measured with a thermometer, it is recommended that  you take 1,000 mg every 8 hours until your temperature falls below 101.5, please not take more than 3,000 mg of acetaminophen either as a separate medication or as in ingredient in an over-the-counter cold/flu preparation within a 24-hour period.      Xylocaine (lidocaine): This is a numbing medication that can be swished for 15 seconds and swallowed.  You can use this every 3 hours while awake to relieve pain in your mouth and throat.  I have sent a prescription for this medication to your pharmacy.   Please follow-up within the next 5-7 days either with your primary care provider or urgent care if your symptoms do not resolve.  If you do not have a primary care provider, we will assist you in  finding one.        Thank you for visiting urgent care today.  We appreciate the opportunity to participate in your care.         This office note has been dictated using Museum/gallery curator.  Unfortunately, this method of dictation can sometimes lead to typographical or grammatical errors.  I apologize for your inconvenience in advance if this occurs.  Please do not hesitate to reach out to me if clarification is needed.      Lynden Oxford Scales, PA-C 03/05/22 1151

## 2022-03-04 NOTE — ED Triage Notes (Signed)
Pt here for sore throat and painful swallowing x 3 days; pt sts possible fever yesterday

## 2022-03-04 NOTE — Discharge Instructions (Addendum)
Your strep test today is negative.  Streptococcal throat culture will be performed per our protocol.  The result of your throat culture will be posted to your MyChart once it is complete, this typically takes 3 to 5 days.  If your streptococcal throat culture is positive, you will be contacted by phone and antibiotics will prescribed for you.   You received a COVID-19 PCR test today.  The result of your COVID-19 test will be posted to your MyChart once it is complete, typically this takes 24 to 36 hours.  If your COVID-19 PCR test is positive, you will be contacted by phone.  Because you do not have a history of being immune compromised, you are currently vaccinated for COVID-19, you are under the age of 60, you do not have a risk of severe disease due to COVID-19, antiviral treatment is not indicated.     Based on my physical exam findings and the history you have provided  today, I do not recommend antibiotics at this time.  I do not believe the risks and side effects of antibiotics would outweigh any minimal benefit that they might provide.          Please see the list below for recommended medications, dosages and frequencies to provide relief of current symptoms:     Advil, Motrin (ibuprofen): This is a good anti-inflammatory medication which addresses aches, pains and inflammation of the upper airways that causes sinus and nasal congestion as well as in the lower airways which makes your cough feel tight and sometimes burn.  I recommend that you take between 400 to 600 mg every 6-8 hours as needed.      Tylenol (acetaminophen): This is a good fever reducer.  If your body temperature rises above 101.5 as measured with a thermometer, it is recommended that you take 1,000 mg every 8 hours until your temperature falls below 101.5, please not take more than 3,000 mg of acetaminophen either as a separate medication or as in ingredient in an over-the-counter cold/flu preparation within a 24-hour period.       Xylocaine (lidocaine): This is a numbing medication that can be swished for 15 seconds and swallowed.  You can use this every 3 hours while awake to relieve pain in your mouth and throat.  I have sent a prescription for this medication to your pharmacy.   Please follow-up within the next 5-7 days either with your primary care provider or urgent care if your symptoms do not resolve.  If you do not have a primary care provider, we will assist you in finding one.        Thank you for visiting urgent care today.  We appreciate the opportunity to participate in your care.

## 2022-03-05 LAB — CULTURE, GROUP A STREP (THRC)

## 2022-03-05 LAB — SARS CORONAVIRUS 2 (TAT 6-24 HRS): SARS Coronavirus 2: NEGATIVE

## 2022-03-06 LAB — CULTURE, GROUP A STREP (THRC)

## 2022-03-07 LAB — CULTURE, GROUP A STREP (THRC)

## 2022-04-16 ENCOUNTER — Ambulatory Visit (INDEPENDENT_AMBULATORY_CARE_PROVIDER_SITE_OTHER): Payer: Commercial Managed Care - HMO | Admitting: Advanced Practice Midwife

## 2022-04-16 ENCOUNTER — Other Ambulatory Visit (HOSPITAL_COMMUNITY)
Admission: RE | Admit: 2022-04-16 | Discharge: 2022-04-16 | Disposition: A | Discharge status: Home / Self Care | Payer: Commercial Managed Care - HMO | Source: Ambulatory Visit | Attending: Obstetrics and Gynecology | Admitting: Obstetrics and Gynecology

## 2022-04-16 ENCOUNTER — Encounter: Payer: Self-pay | Admitting: Advanced Practice Midwife

## 2022-04-16 VITALS — BP 154/97 | HR 85 | Ht 65.0 in | Wt 229.2 lb

## 2022-04-16 DIAGNOSIS — Z124 Encounter for screening for malignant neoplasm of cervix: Secondary | ICD-10-CM

## 2022-04-16 DIAGNOSIS — Z113 Encounter for screening for infections with a predominantly sexual mode of transmission: Secondary | ICD-10-CM | POA: Insufficient documentation

## 2022-04-16 DIAGNOSIS — R928 Other abnormal and inconclusive findings on diagnostic imaging of breast: Secondary | ICD-10-CM

## 2022-04-16 DIAGNOSIS — Z01419 Encounter for gynecological examination (general) (routine) without abnormal findings: Secondary | ICD-10-CM | POA: Diagnosis not present

## 2022-04-16 DIAGNOSIS — N970 Female infertility associated with anovulation: Secondary | ICD-10-CM

## 2022-04-16 DIAGNOSIS — Z1231 Encounter for screening mammogram for malignant neoplasm of breast: Secondary | ICD-10-CM | POA: Diagnosis not present

## 2022-04-16 NOTE — Addendum Note (Signed)
Addended by: Flonnie Hailstone on: 04/16/2022 04:29 PM   Modules accepted: Orders

## 2022-04-16 NOTE — Progress Notes (Signed)
Subjective:     Jamie Mcbride is a 46 y.o. female here at [facility] for a routine exam.  Current complaints: no gyn concerns, regular menses. She does desire pregnancy, and had fertility evaluation and surgery for adhesions by Dr Hughie Closs in 2017 but then got divorced soon afterwards and is now remarried. She knows her age is a factor but wants to know if pregnancy is possible.  Personal health questionnaire reviewed: yes.  Do you have a primary care provider? yes Do you feel safe at home? yes  [place] Visit from 04/16/2022 in [place]  PHQ-2 Total Score 0       Health Maintenance Due  Topic Date Due   COVID-19 Vaccine (1) Never done   Hepatitis C Screening  Never done   COLONOSCOPY (Pts 45-55yr Insurance coverage will need to be confirmed)  Never done   PAP SMEAR-Modifier  12/03/2021   INFLUENZA VACCINE  12/19/2021     Risk factors for chronic health problems: Smoking: Alchohol/how much: Pt BMI: Body mass index is 38.14 kg/m.   Gynecologic History Patient's last menstrual period was 03/24/2022. Contraception: none Last Pap: unknown. Results were: normal per pt Last mammogram: 2020. Results were: normal  Obstetric History OB History  Gravida Para Term Preterm AB Living  2 0 0 0 2 0  SAB IAB Ectopic Multiple Live Births  2 0 0 0      # Outcome Date GA Lbr Len/2nd Weight Sex Delivery Anes PTL Lv  2 SAB 2016          1 SAB 2013 [id number]  SAB        Birth Comments: System Generated. Please review and update pregnancy details.     The following portions of the patient's history were reviewed and updated as appropriate: allergies, current medications, past family history, past medical history, past social history, past surgical history, and problem list.  Review of Systems Pertinent items noted in HPI and remainder of comprehensive ROS otherwise negative.    Objective:   VS reviewed, nursing note reviewed,   Constitutional: well developed, well nourished, no distress HEENT: normocephalic, thyroid without enlargement or mass HEART: RRR, no murmurs rubs/gallops RESP: clear and equal to auscultation bilaterally in all lobes  Breast Exam: exam performed: right breast normal without mass, skin or nipple changes or axillary nodes, left breast normal without mass, skin or nipple changes or axillary nodes Abdomen: soft Neuro: alert and oriented x 3 Skin: warm, dry Psych: affect normal Pelvic exam: Performed: Cervix pink, visually closed, without lesion, scant white creamy discharge, vaginal walls and external genitalia normal Bimanual exam: Cervix 0/long/high, firm, anterior, neg CMT, uterus nontender, nonenlarged, adnexa without tenderness, enlargement, or mass        Assessment/Plan:   1. Infertility associated with anovulation --Pt did fertility work-up and had surgery for adhesions with Dr YaHughie Clossn 2017 but has since divorced and remarried and would still like to become pregnant if possible. - Anti mullerian hormone  2. Routine screening for STI (sexually transmitted infection)  - RPR - HIV antibody (with reflex) - Hepatitis B Surface AntiGEN - Hepatitis C Antibody  3. Screening mammogram for breast cancer  - MM DIGITAL SCREENING BILATERAL; Future  4. Screening for cervical cancer  - Cytology - PAP( [place])    5. Well woman exam with routine gynecological exam    Return in about 1 year (around 04/17/2023) for annual exam.   LiLattie Haw  [place], CNM 3:51 PM

## 2022-04-16 NOTE — Progress Notes (Signed)
Pt presents for AEX. Pt requesting STD testing. Last mammogram 01/2019.

## 2022-04-18 LAB — CERVICOVAGINAL ANCILLARY ONLY
Chlamydia: NEGATIVE
Comment: NEGATIVE
Comment: NEGATIVE
Comment: NORMAL
Neisseria Gonorrhea: NEGATIVE
Trichomonas: NEGATIVE

## 2022-04-18 NOTE — Addendum Note (Signed)
Addended by: Fatima Blank A on: 04/18/2022 01:22 PM   Modules accepted: Orders

## 2022-04-19 LAB — CYTOLOGY - PAP
Adequacy: ABSENT
Comment: NEGATIVE
Diagnosis: NEGATIVE
High risk HPV: NEGATIVE

## 2022-04-20 LAB — RPR: RPR Ser Ql: NONREACTIVE

## 2022-04-20 LAB — HIV ANTIBODY (ROUTINE TESTING W REFLEX): HIV Screen 4th Generation wRfx: NONREACTIVE

## 2022-04-20 LAB — HEPATITIS B SURFACE ANTIGEN: Hepatitis B Surface Ag: NEGATIVE

## 2022-04-20 LAB — ANTI MULLERIAN HORMONE: ANTI-MULLERIAN HORMONE (AMH): 0.586 ng/mL

## 2022-04-20 LAB — HEPATITIS C ANTIBODY: Hep C Virus Ab: NONREACTIVE

## 2022-04-23 ENCOUNTER — Ambulatory Visit (HOSPITAL_BASED_OUTPATIENT_CLINIC_OR_DEPARTMENT_OTHER)
Admission: RE | Admit: 2022-04-23 | Discharge: 2022-04-23 | Disposition: A | Discharge status: Home / Self Care | Payer: Commercial Managed Care - HMO | Source: Ambulatory Visit | Attending: Advanced Practice Midwife | Admitting: Advanced Practice Midwife

## 2022-04-23 ENCOUNTER — Encounter: Payer: Self-pay | Admitting: Advanced Practice Midwife

## 2022-04-23 DIAGNOSIS — Z1231 Encounter for screening mammogram for malignant neoplasm of breast: Secondary | ICD-10-CM | POA: Insufficient documentation

## 2022-04-26 NOTE — Progress Notes (Signed)
TC using [place]. No answer. Interpreter left VM with request for call back. Pt is active in [place] and does speak some English. Message with [place] translation sent via MyChart with information on test results and contact infor for Dr. Darreld Mclean.

## 2022-05-03 ENCOUNTER — Ambulatory Visit
Admission: RE | Admit: 2022-05-03 | Discharge: 2022-05-03 | Disposition: A | Discharge status: Home / Self Care | Payer: Commercial Managed Care - HMO | Source: Ambulatory Visit | Attending: Advanced Practice Midwife | Admitting: Advanced Practice Midwife

## 2022-05-03 ENCOUNTER — Other Ambulatory Visit: Payer: Self-pay | Admitting: Advanced Practice Midwife

## 2022-05-03 DIAGNOSIS — N6320 Unspecified lump in the left breast, unspecified quadrant: Secondary | ICD-10-CM

## 2022-05-03 DIAGNOSIS — R928 Other abnormal and inconclusive findings on diagnostic imaging of breast: Secondary | ICD-10-CM

## 2022-05-18 ENCOUNTER — Ambulatory Visit
Admission: RE | Admit: 2022-05-18 | Discharge: 2022-05-18 | Disposition: A | Discharge status: Home / Self Care | Payer: Commercial Managed Care - HMO | Source: Ambulatory Visit | Attending: Advanced Practice Midwife | Admitting: Advanced Practice Midwife

## 2022-05-18 DIAGNOSIS — N6320 Unspecified lump in the left breast, unspecified quadrant: Secondary | ICD-10-CM

## 2022-05-18 HISTORY — PX: BREAST BIOPSY: SHX20

## 2022-08-21 DIAGNOSIS — R42 Dizziness and giddiness: Secondary | ICD-10-CM | POA: Insufficient documentation

## 2022-08-21 DIAGNOSIS — R011 Cardiac murmur, unspecified: Secondary | ICD-10-CM | POA: Insufficient documentation

## 2022-08-22 ENCOUNTER — Other Ambulatory Visit (HOSPITAL_COMMUNITY): Payer: Self-pay | Admitting: Physician Assistant

## 2022-08-22 DIAGNOSIS — R011 Cardiac murmur, unspecified: Secondary | ICD-10-CM

## 2022-08-23 ENCOUNTER — Ambulatory Visit (HOSPITAL_COMMUNITY)
Admission: RE | Admit: 2022-08-23 | Discharge: 2022-08-23 | Disposition: A | Discharge status: Home / Self Care | Payer: Commercial Managed Care - HMO | Source: Ambulatory Visit | Attending: Physician Assistant | Admitting: Physician Assistant

## 2022-08-23 DIAGNOSIS — R011 Cardiac murmur, unspecified: Secondary | ICD-10-CM | POA: Insufficient documentation

## 2022-08-23 DIAGNOSIS — I10 Essential (primary) hypertension: Secondary | ICD-10-CM | POA: Insufficient documentation

## 2022-08-23 LAB — ECHOCARDIOGRAM COMPLETE
AR max vel: 1.57 cm2
AV Area VTI: 1.52 cm2
AV Area mean vel: 1.49 cm2
AV Mean grad: 6.5 mmHg
AV Peak grad: 12.3 mmHg
Ao pk vel: 1.75 m/s
Area-P 1/2: 3.77 cm2
Calc EF: 74 %
S' Lateral: 2.7 cm
Single Plane A2C EF: 72.2 %
Single Plane A4C EF: 74.7 %

## 2023-01-11 ENCOUNTER — Encounter (HOSPITAL_COMMUNITY): Payer: Self-pay | Admitting: *Deleted

## 2023-01-11 ENCOUNTER — Emergency Department (HOSPITAL_COMMUNITY)
Admission: EM | Admit: 2023-01-11 | Discharge: 2023-01-11 | Disposition: A | Discharge status: Home / Self Care | Payer: Commercial Managed Care - HMO | Attending: Emergency Medicine | Admitting: Emergency Medicine

## 2023-01-11 ENCOUNTER — Emergency Department (HOSPITAL_COMMUNITY): Payer: Commercial Managed Care - HMO

## 2023-01-11 ENCOUNTER — Other Ambulatory Visit: Payer: Self-pay

## 2023-01-11 DIAGNOSIS — I10 Essential (primary) hypertension: Secondary | ICD-10-CM

## 2023-01-11 DIAGNOSIS — Z79899 Other long term (current) drug therapy: Secondary | ICD-10-CM | POA: Diagnosis not present

## 2023-01-11 DIAGNOSIS — Z79890 Hormone replacement therapy (postmenopausal): Secondary | ICD-10-CM | POA: Diagnosis not present

## 2023-01-11 DIAGNOSIS — R42 Dizziness and giddiness: Secondary | ICD-10-CM | POA: Diagnosis not present

## 2023-01-11 DIAGNOSIS — E039 Hypothyroidism, unspecified: Secondary | ICD-10-CM | POA: Diagnosis not present

## 2023-01-11 LAB — CBC
HCT: 41.5 % (ref 36.0–46.0)
Hemoglobin: 13.2 g/dL (ref 12.0–15.0)
MCH: 20.8 pg — ABNORMAL LOW (ref 26.0–34.0)
MCHC: 31.8 g/dL (ref 30.0–36.0)
MCV: 65.5 fL — ABNORMAL LOW (ref 80.0–100.0)
Platelets: 144 10*3/uL — ABNORMAL LOW (ref 150–400)
RBC: 6.34 MIL/uL — ABNORMAL HIGH (ref 3.87–5.11)
RDW: 16.1 % — ABNORMAL HIGH (ref 11.5–15.5)
WBC: 5.4 10*3/uL (ref 4.0–10.5)
nRBC: 0 % (ref 0.0–0.2)

## 2023-01-11 LAB — URINALYSIS, ROUTINE W REFLEX MICROSCOPIC
Bilirubin Urine: NEGATIVE
Glucose, UA: NEGATIVE mg/dL
Hgb urine dipstick: NEGATIVE
Ketones, ur: NEGATIVE mg/dL
Leukocytes,Ua: NEGATIVE
Nitrite: NEGATIVE
Protein, ur: NEGATIVE mg/dL
Specific Gravity, Urine: 1.013 (ref 1.005–1.030)
pH: 7 (ref 5.0–8.0)

## 2023-01-11 LAB — BASIC METABOLIC PANEL
Anion gap: 12 (ref 5–15)
BUN: 11 mg/dL (ref 6–20)
CO2: 22 mmol/L (ref 22–32)
Calcium: 9.1 mg/dL (ref 8.9–10.3)
Chloride: 103 mmol/L (ref 98–111)
Creatinine, Ser: 0.82 mg/dL (ref 0.44–1.00)
GFR, Estimated: >60 mL/min (ref >60)
Glucose, Bld: 80 mg/dL (ref 70–99)
Potassium: 3.3 mmol/L — ABNORMAL LOW (ref 3.5–5.1)
Sodium: 137 mmol/L (ref 135–145)

## 2023-01-11 LAB — PREGNANCY, URINE: Preg Test, Ur: NEGATIVE

## 2023-01-11 LAB — TROPONIN I (HIGH SENSITIVITY): Troponin I (High Sensitivity): 9 ng/L (ref <18)

## 2023-01-11 MED ORDER — MECLIZINE HCL 25 MG PO TABS: 25.0000 mg | ORAL_TABLET | Freq: Three times a day (TID) | ORAL | 0 refills | Status: AC | PRN

## 2023-01-11 MED ORDER — METOPROLOL TARTRATE 5 MG/5ML IV SOLN
5.0000 mg | Freq: Once | INTRAVENOUS | Status: AC
  Administered 2023-01-11: 5 mg via INTRAVENOUS
  Filled 2023-01-11: qty 5

## 2023-01-11 MED ORDER — MECLIZINE HCL 25 MG PO TABS
25.0000 mg | ORAL_TABLET | Freq: Once | ORAL | Status: AC
  Administered 2023-01-11: 25 mg via ORAL
  Filled 2023-01-11: qty 1

## 2023-01-11 MED ORDER — METOPROLOL SUCCINATE ER 25 MG PO TB24: 25.0000 mg | ORAL_TABLET | Freq: Every day | ORAL | 0 refills | Status: DC

## 2023-01-11 NOTE — ED Notes (Signed)
Blood work in triage

## 2023-01-11 NOTE — ED Notes (Signed)
X ray in room.

## 2023-01-11 NOTE — ED Provider Notes (Signed)
[place] EMERGENCY DEPARTMENT AT [facility] Provider Note   CSN: [id number] Arrival date & time: 01/11/23  1200     History  Chief Complaint  Patient presents with   Dizziness    Jamie Mcbride is a 47 y.o. female.  Pt is a 47 yo female with pmhx significant for htn and hypothyroidism.  Pt said she's had dizziness for the past few days.  She went to [facility] and was sent here for further eval.  Pt reports compliance with bp meds (amlodipine 10 mg daily).  No other sx.        Home Medications Prior to Admission medications   Medication Sig Start Date End Date Taking? Authorizing Provider  meclizine (ANTIVERT) 25 MG tablet Take 1 tablet (25 mg total) by mouth 3 (three) times daily as needed for dizziness. 01/11/23  Yes Jacalyn Lefevre, MD  metoprolol succinate (TOPROL-XL) 25 MG 24 hr tablet Take 1 tablet (25 mg total) by mouth daily. 01/11/23  Yes Jacalyn Lefevre, MD  amLODipine (NORVASC) 5 MG tablet Take 5 mg by mouth daily.    [provider]  levothyroxine (SYNTHROID) 25 MCG tablet Take 25 mcg by mouth daily before breakfast.    [provider]  PRESCRIPTION MEDICATION Name? - hormones    [provider]  Vitamin D, Ergocalciferol, (DRISDOL) 1.25 MG (50000 UT) CAPS capsule Take 1 capsule (50,000 Units total) by mouth every 7 (seven) days. 12/30/18   Genia Del, MD      Allergies    Patient has no known allergies.    Review of Systems   Review of Systems  Neurological:  Positive for dizziness.  All other systems reviewed and are negative.   Physical Exam Updated Vital Signs BP (!) 154/94   Pulse 63   Temp 98.4 F (36.9 C) (Oral)   Resp 13   Ht 5\' 5"  (1.651 m)   Wt 104.3 kg   SpO2 100%   BMI 38.27 kg/m  Physical Exam Vitals and nursing note reviewed.  Constitutional:      Appearance: Normal appearance.  HENT:     Head: Normocephalic and atraumatic.     Right Ear: External ear normal.     Left Ear: External ear normal.      Nose: Nose normal.     Mouth/Throat:     Mouth: Mucous membranes are moist.     Pharynx: Oropharynx is clear.  Eyes:     Extraocular Movements: Extraocular movements intact.     Conjunctiva/sclera: Conjunctivae normal.     Pupils: Pupils are equal, round, and reactive to light.  Cardiovascular:     Rate and Rhythm: Normal rate and regular rhythm.     Pulses: Normal pulses.     Heart sounds: Normal heart sounds.  Pulmonary:     Effort: Pulmonary effort is normal.     Breath sounds: Normal breath sounds.  Abdominal:     General: Abdomen is flat. Bowel sounds are normal.     Palpations: Abdomen is soft.  Musculoskeletal:        General: Normal range of motion.     Cervical back: Normal range of motion and neck supple.  Skin:    General: Skin is warm.     Capillary Refill: Capillary refill takes less than 2 seconds.  Neurological:     General: No focal deficit present.     Mental Status: She is alert and oriented to person, place, and time.  Psychiatric:  Mood and Affect: Mood normal.        Behavior: Behavior normal.     ED Results / Procedures / Treatments   Labs (all labs ordered are listed, but only abnormal results are displayed) Labs Reviewed  BASIC METABOLIC PANEL - Abnormal; Notable for the following components:      Result Value   Potassium 3.3 (*)    All other components within normal limits  CBC - Abnormal; Notable for the following components:   RBC 6.34 (*)    MCV 65.5 (*)    MCH 20.8 (*)    RDW 16.1 (*)    Platelets 144 (*)    All other components within normal limits  URINALYSIS, ROUTINE W REFLEX MICROSCOPIC  PREGNANCY, URINE  TROPONIN I (HIGH SENSITIVITY)  TROPONIN I (HIGH SENSITIVITY)    EKG EKG Interpretation Date/Time:  Friday January 11 2023 13:05:41 EDT Ventricular Rate:  64 PR Interval:  191 QRS Duration:  86 QT Interval:  442 QTC Calculation: 456 R Axis:   37  Text Interpretation: Sinus rhythm Borderline T abnormalities,  anterior leads No significant change since last tracing Confirmed by Jacalyn Lefevre [phone]) on 01/11/2023 1:11:32 PM  Radiology CT Head Wo Contrast  Result Date: 01/11/2023 CLINICAL DATA:  Neurologic deficit, dizziness EXAM: CT HEAD WITHOUT CONTRAST TECHNIQUE: Contiguous axial images were obtained from the base of the skull through the vertex without intravenous contrast. RADIATION DOSE REDUCTION: This exam was performed according to the departmental dose-optimization program which includes automated exposure control, adjustment of the mA and/or kV according to patient size and/or use of iterative reconstruction technique. COMPARISON:  09/24/2018, 07/17/2013 FINDINGS: Brain: No acute infarct or hemorrhage. Lateral ventricles and midline structures are unremarkable. No acute extra-axial fluid collections. No mass effect. Vascular: No hyperdense vessel or unexpected calcification. Skull: Normal. Negative for fracture or focal lesion. Sinuses/Orbits: Small left mastoid effusion. Remaining paranasal sinuses are clear. Other: None. IMPRESSION: 1. No acute intracranial process. Electronically Signed   By: Sharlet Salina M.D.   On: 01/11/2023 15:35   DG Chest Port 1 View  Result Date: 01/11/2023 CLINICAL DATA:  Hypertension EXAM: PORTABLE CHEST 1 VIEW COMPARISON:  None Available. FINDINGS: Borderline size heart. No consolidation, pneumothorax or effusion. No edema. Overlapping cardiac leads. IMPRESSION: Borderline size heart.  No acute cardiopulmonary disease Electronically Signed   By: Karen Kays M.D.   On: 01/11/2023 14:36    Procedures Procedures    Medications Ordered in ED Medications  meclizine (ANTIVERT) tablet 25 mg (25 mg Oral Given 01/11/23 1314)  metoprolol tartrate (LOPRESSOR) injection 5 mg (5 mg Intravenous Given 01/11/23 1357)    ED Course/ Medical Decision Making/ A&P                                 Medical Decision Making Amount and/or Complexity of Data Reviewed Labs:  ordered. Radiology: ordered.  Risk Prescription drug management.   This patient presents to the ED for concern of dizziness, this involves an extensive number of treatment options, and is a complaint that carries with it a high risk of complications and morbidity.  The differential diagnosis includes cva, ich, electrolyte abn, anemia   Co morbidities that complicate the patient evaluation  Htn and hypothyroidism   Additional history obtained:  Additional history obtained from epic chart review    Lab Tests:  I Ordered, and personally interpreted labs.  The pertinent results include:  bmp nl, cbc  nl other than plt low at 144, trop  nl, ua neg, preg neg   Imaging Studies ordered:  I ordered imaging studies including cxr and ct head I independently visualized and interpreted imaging which showed  CXR: Borderline size heart.  No acute cardiopulmonary disease  CT head: 1. No acute intracranial process.   I agree with the radiologist interpretation   Cardiac Monitoring:  The patient was maintained on a cardiac monitor.  I personally viewed and interpreted the cardiac monitored which showed an underlying rhythm of: nsr   Medicines ordered and prescription drug management:  I ordered medication including lopressor  for htn and antivert for dizziness  Reevaluation of the patient after these medicines showed that the patient improved I have reviewed the patients home medicines and have made adjustments as needed   Test Considered:  ct   Critical Interventions:  lopressor  Problem List / ED Course:  HTN:  improved with lopressor.  I will d/c pt with metoprolol.  Pt needs to f/u with pcp. Dizziness:  improved   Reevaluation:  After the interventions noted above, I reevaluated the patient and found that they have :improved   Social Determinants of Health:  Lives at home   Dispostion:  After consideration of the diagnostic results and the patients  response to treatment, I feel that the patent would benefit from discharge with outpatient f/u.          Final Clinical Impression(s) / ED Diagnoses Final diagnoses:  Hypertension, unspecified type  Dizziness    Rx / DC Orders ED Discharge Orders          Ordered    metoprolol succinate (TOPROL-XL) 25 MG 24 hr tablet  Daily        01/11/23 1548    meclizine (ANTIVERT) 25 MG tablet  3 times daily PRN        01/11/23 1548              Jacalyn Lefevre, MD 01/11/23 1551

## 2023-01-11 NOTE — ED Notes (Addendum)
Transported to CT 

## 2023-01-11 NOTE — ED Triage Notes (Signed)
C/O dizziness onset Wed. 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 Denies pain

## 2023-04-22 ENCOUNTER — Ambulatory Visit: Payer: Commercial Managed Care - HMO | Attending: Cardiology | Admitting: Cardiology

## 2023-04-22 ENCOUNTER — Encounter: Payer: Self-pay | Admitting: Cardiology

## 2023-04-22 VITALS — BP 164/80 | HR 76 | Wt 246.6 lb

## 2023-04-22 DIAGNOSIS — R002 Palpitations: Secondary | ICD-10-CM | POA: Diagnosis not present

## 2023-04-22 DIAGNOSIS — I517 Cardiomegaly: Secondary | ICD-10-CM | POA: Diagnosis not present

## 2023-04-22 DIAGNOSIS — I10 Essential (primary) hypertension: Secondary | ICD-10-CM

## 2023-04-22 MED ORDER — HYDROCHLOROTHIAZIDE 12.5 MG PO CAPS: 12.5000 mg | ORAL_CAPSULE | Freq: Every day | ORAL | 0 refills | Status: DC

## 2023-04-22 MED ORDER — AMLODIPINE BESYLATE 10 MG PO TABS: 10.0000 mg | ORAL_TABLET | Freq: Every day | ORAL | 3 refills | Status: DC

## 2023-04-22 NOTE — Patient Instructions (Addendum)
Medication Instructions:  Your physician has recommended you make the following change in your medication:  INCREASE: Amlodipine 10 mg once daily DECREASE: Hydrochlorothiazide 12.5 mg once daily *If you need a refill on your cardiac medications before your next appointment, please call your pharmacy*   Testing/Procedures: ZIO XT- Long Term Monitor Instructions  Your physician has requested you wear a ZIO patch monitor for 14 days.  This is a single patch monitor. [employer] supplies one patch monitor per enrollment. Additional stickers are not available. Please do not apply patch if you will be having a Nuclear Stress Test,  Echocardiogram, Cardiac CT, MRI, or Chest Xray during the period you would be wearing the  monitor. The patch cannot be worn during these tests. You cannot remove and re-apply the  ZIO XT patch monitor.  Your ZIO patch monitor will be mailed 3 day USPS to your address on file. It may take 3-5 days  to receive your monitor after you have been enrolled.  Once you have received your monitor, please review the enclosed instructions. Your monitor  has already been registered assigning a specific monitor serial # to you.  Billing and Patient Assistance Program Information  We have supplied [employer] with any of your insurance information on file for billing purposes. [employer] offers a sliding scale Patient Assistance Program for patients that do not have  insurance, or whose insurance does not completely cover the cost of the ZIO monitor.  You must apply for the Patient Assistance Program to qualify for this discounted rate.  To apply, please call [employer] at [phone], select option 4, select option 2, ask to apply for  Patient Assistance Program. Meredeth Ide will ask your household income, and how many people  are in your household. They will quote your out-of-pocket cost based on that information.  [employer] will also be able to set up a 78-month, interest-free payment plan if  needed.  Applying the monitor   Shave hair from upper left chest.  Hold abrader disc by orange tab. Rub abrader in 40 strokes over the upper left chest as  indicated in your monitor instructions.  Clean area with 4 enclosed alcohol pads. Let dry.  Apply patch as indicated in monitor instructions. Patch will be placed under collarbone on left  side of chest with arrow pointing upward.  Rub patch adhesive wings for 2 minutes. Remove white label marked "1". Remove the white  label marked "2". Rub patch adhesive wings for 2 additional minutes.  While looking in a mirror, press and release button in center of patch. A small green light will  flash 3-4 times. This will be your only indicator that the monitor has been turned on.  Do not shower for the first 24 hours. You may shower after the first 24 hours.  Press the button if you feel a symptom. You will hear a small click. Record Date, Time and  Symptom in the Patient Logbook.  When you are ready to remove the patch, follow instructions on the last 2 pages of Patient  Logbook. Stick patch monitor onto the last page of Patient Logbook.  Place Patient Logbook in the blue and white box. Use locking tab on box and tape box closed  securely. The blue and white box has prepaid postage on it. Please place it in the mailbox as  soon as possible. Your physician should have your test results approximately 7 days after the  monitor has been mailed back to [facility]  Call [employer]  [facility] at [phone] if you have questions regarding  your ZIO XT patch monitor. Call them immediately if you see an orange light blinking on your  monitor.  If your monitor falls off in less than 4 days, contact our Monitor department at [phone].  If your monitor becomes loose or falls off after 4 days call [employer] at [phone] for  suggestions on securing your monitor    Follow-Up: At [facility], you and your health needs are  our priority.  As part of our continuing mission to provide you with exceptional heart care, we have created designated Provider Care Teams.  These Care Teams include your primary Cardiologist (physician) and Advanced Practice Providers (APPs -  Physician Assistants and Nurse Practitioners) who all work together to provide you with the care you need, when you need it.   Your next appointment:   8 week(s)  Provider:   Thomasene Ripple, DO     Other Instructions Please see our pharmacy staff in 2-4 weeks.   Mediterranean Diet A Mediterranean diet is based on the traditions of countries on the [employer]. It focuses on eating more: Fruits and vegetables. Whole grains, beans, nuts, and seeds. Heart-healthy fats. These are fats that are good for your heart. It involves eating less: Dairy. Meat and eggs. Processed foods with added sugar, salt, and fat. This type of diet can help prevent certain conditions. It can also improve outcomes if you have a long-term (chronic) disease, such as kidney or heart disease. What are tips for following this plan? Reading food labels Check packaged foods for: The serving size. For foods such as rice and pasta, the serving size is the amount of cooked product, not dry. The total fat. Avoid foods with saturated fat or trans fat. Added sugars, such as corn syrup. Shopping  Try to have a balanced diet. Buy a variety of foods, such as: Fresh fruits and vegetables. You may be able to get these from local farmers markets. You can also buy them frozen. Grains, beans, nuts, and seeds. Some of these can be bought in bulk. Fresh seafood. Poultry and eggs. Low-fat dairy products. Buy whole ingredients instead of foods that have already been packaged. If you can't get fresh seafood, buy precooked frozen shrimp or canned fish, such as tuna, salmon, or sardines. Stock your pantry so you always have certain foods on hand, such as olive oil, canned tuna, canned  tomatoes, rice, pasta, and beans. Cooking Cook foods with extra-virgin olive oil instead of using butter or other vegetable oils. Have meat as a side dish. Have vegetables or grains as your main dish. This means having meat in small portions or adding small amounts of meat to foods like pasta or stew. Use beans or vegetables instead of meat in common dishes like chili or lasagna. Try out different cooking methods. Try roasting, broiling, steaming, and sauting vegetables. Add frozen vegetables to soups, stews, pasta, or rice. Add nuts or seeds for added healthy fats and plant protein at each meal. You can add these to yogurt, salads, or vegetable dishes. Marinate fish or vegetables using olive oil, lemon juice, garlic, and fresh herbs. Meal planning Plan to eat a vegetarian meal one day each week. Try to work up to two vegetarian meals, if possible. Eat seafood two or more times a week. Have healthy snacks on hand. These may include: Vegetable sticks with hummus. Greek yogurt. Fruit and nut trail mix. Eat balanced meals. These should include: Fruit: 2-3 servings  a day. Vegetables: 4-5 servings a day. Low-fat dairy: 2 servings a day. Fish, poultry, or lean meat: 1 serving a day. Beans and legumes: 2 or more servings a week. Nuts and seeds: 1-2 servings a day. Whole grains: 6-8 servings a day. Extra-virgin olive oil: 3-4 servings a day. Limit red meat and sweets to just a few servings a month. Lifestyle  Try to cook and eat meals with your family. Drink enough fluid to keep your pee (urine) pale yellow. Be active every day. This includes: Aerobic exercise, which is exercise that causes your heart to beat faster. Examples include running and swimming. Leisure activities like gardening, walking, or housework. Get 7-8 hours of sleep each night. Drink red wine if your provider says you can. A glass of wine is 5 oz (150 mL). You may be allowed to have: Up to 1 glass a day if you're female  and not pregnant. Up to 2 glasses a day if you're female. What foods should I eat? Fruits Apples. Apricots. Avocado. Berries. Bananas. Cherries. Dates. Figs. Grapes. Lemons. Melon. Oranges. Peaches. Plums. Pomegranate. Vegetables Artichokes. Beets. Broccoli. Cabbage. Carrots. Eggplant. Green beans. Chard. Kale. Spinach. Onions. Leeks. Peas. Squash. Tomatoes. Peppers. Radishes. Grains Whole-grain pasta. Brown rice. Bulgur wheat. Polenta. Couscous. Whole-wheat bread. Orpah Cobb. Meats and other proteins Beans. Almonds. Sunflower seeds. Pine nuts. Peanuts. Cod. Salmon. Scallops. Shrimp. Tuna. Tilapia. Clams. Oysters. Eggs. Chicken or [place] without skin. Dairy Low-fat milk. Cheese. Greek yogurt. Fats and oils Extra-virgin olive oil. Avocado oil. Grapeseed oil. Beverages Water. Red wine. Herbal tea. Sweets and desserts Greek yogurt with honey. Baked apples. Poached pears. Trail mix. Seasonings and condiments Basil. Cilantro. Coriander. Cumin. Mint. Parsley. Sage. Rosemary. Tarragon. Garlic. Oregano. Thyme. Pepper. Balsamic vinegar. Tahini. Hummus. Tomato sauce. Olives. Mushrooms. The items listed above may not be all the foods and drinks you can have. Talk to a dietitian to learn more. What foods should I limit? This is a list of foods that should be eaten rarely. Fruits Fruit canned in syrup. Vegetables Deep-fried potatoes, like [place] fries. Grains Packaged pasta or rice dishes. Cereal with added sugar. Snacks with added sugar. Meats and other proteins Beef. Pork. Lamb. Chicken or [place] with skin. Hot dogs. Tomasa Blase. Dairy Ice cream. Sour cream. Whole milk. Fats and oils Butter. Canola oil. Vegetable oil. Beef fat (tallow). Lard. Beverages Juice. Sugar-sweetened soft drinks. Beer. Liquor and spirits. Sweets and desserts Cookies. Cakes. Pies. Candy. Seasonings and condiments Mayonnaise. Pre-made sauces and marinades. The items listed above may not be all the foods and drinks  you should limit. Talk to a dietitian to learn more. Where to find more information American Heart Association (AHA): heart.org This information is not intended to replace advice given to you by your health care provider. Make sure you discuss any questions you have with your health care provider. Document Revised: 08/19/2022 Document Reviewed: 08/19/2022 Elsevier Patient Education  2024 [employer].

## 2023-04-22 NOTE — Progress Notes (Signed)
Cardiology Office Note:    Date:  04/22/2023   ID:  Jamie Mcbride, DOB 04-01-1976, MRN [id number]  PCP:  Hillery Aldo, PA-C  Cardiologist:  Thomasene Ripple, DO  Electrophysiologist:  None   Referring MD: Juliet Rude*   " I am here because of my echo results"  History of Present Illness:    Jamie Mcbride is a 47 y.o. female with a history of hypertension since 2016, has been on amlodipine and metoprolol. Despite medication, the patient's blood pressure remains uncontrolled, with readings varying from high to normal. The patient also experiences palpitations, the frequency of which varies from two to four times a day. The patient has a family history of prediabetes and is aware of the need to control sugar intake. The patient also mentions a need to lose weight but has been unsuccessful so far.  Past Medical History:  Diagnosis Date   Elevated prolactin level    DX 2011   History of concussion    07-17-2013 W/ LOC (NEGATIVE CT)  NO RESIDUAL   Hypertension    Pelvic adhesions     Past Surgical History:  Procedure Laterality Date   APPENDECTOMY  2001   BREAST BIOPSY Left 05/18/2022   [place] LT BREAST BX W LOC DEV 1ST LESION IMG BX SPEC US GUIDE 05/18/2022 [facility]   LAPAROSCOPY N/A 10/11/2015   Procedure: LAPROSCOPIC LYSIS OF ADHESION WITH LYSIS AND EXCISION OF RIGHT OVARY ADHESION;  Surgeon: Fermin Schwab, MD;  Location: [facility];  Service: Gynecology;  Laterality: N/A;    Current Medications: Current Meds  Medication Sig   amLODipine (NORVASC) 10 MG tablet Take 1 tablet (10 mg total) by mouth daily.   hydrochlorothiazide (MICROZIDE) 12.5 MG capsule Take 1 capsule (12.5 mg total) by mouth daily.   levothyroxine (SYNTHROID) 25 MCG tablet Take 25 mcg by mouth daily before breakfast.   meclizine (ANTIVERT) 25 MG tablet Take 1 tablet (25 mg total) by mouth 3 (three) times daily as needed for dizziness.   metoprolol succinate (TOPROL-XL)  25 MG 24 hr tablet Take 1 tablet (25 mg total) by mouth daily.   PRESCRIPTION MEDICATION Name? - hormones   Vitamin D, Ergocalciferol, (DRISDOL) 1.25 MG (50000 UT) CAPS capsule Take 1 capsule (50,000 Units total) by mouth every 7 (seven) days.   [DISCONTINUED] amLODipine (NORVASC) 5 MG tablet Take 5 mg by mouth daily.     Allergies:   Patient has no known allergies.   Social History   Socioeconomic History   Marital status: Married    Spouse name: Not on file   Number of children: Not on file   Years of education: Not on file   Highest education level: Not on file  Occupational History   Not on file  Tobacco Use   Smoking status: Never   Smokeless tobacco: Never  Vaping Use   Vaping status: Never Used  Substance and Sexual Activity   Alcohol use: No   Drug use: No   Sexual activity: Yes    Partners: Male  Other Topics Concern   Not on file  Social History Narrative   Not on file   Social Determinants of Health   Financial Resource Strain: Not on file  Food Insecurity: Not on file  Transportation Needs: Not on file  Physical Activity: Not on file  Stress: Not on file  Social Connections: Not on file     Family History: The patient's family history includes Diabetes in her father;  Hypertension in her father. There is no history of Other.  ROS:   Review of Systems  Constitution: Negative for decreased appetite, fever and weight gain.  HENT: Negative for congestion, ear discharge, hoarse voice and sore throat.   Eyes: Negative for discharge, redness, vision loss in right eye and visual halos.  Cardiovascular: Negative for chest pain, dyspnea on exertion, leg swelling, orthopnea and palpitations.  Respiratory: Negative for cough, hemoptysis, shortness of breath and snoring.   Endocrine: Negative for heat intolerance and polyphagia.  Hematologic/Lymphatic: Negative for bleeding problem. Does not bruise/bleed easily.  Skin: Negative for flushing, nail changes, rash and  suspicious lesions.  Musculoskeletal: Negative for arthritis, joint pain, muscle cramps, myalgias, neck pain and stiffness.  Gastrointestinal: Negative for abdominal pain, bowel incontinence, diarrhea and excessive appetite.  Genitourinary: Negative for decreased libido, genital sores and incomplete emptying.  Neurological: Negative for brief paralysis, focal weakness, headaches and loss of balance.  Psychiatric/Behavioral: Negative for altered mental status, depression and suicidal ideas.  Allergic/Immunologic: Negative for HIV exposure and persistent infections.    EKGs/Labs/Other Studies Reviewed:    The following studies were reviewed today:   EKG:  The ekg ordered today demonstrates   Recent Labs: 01/11/2023: BUN 11; Creatinine, Ser 0.82; Hemoglobin 13.2; Platelets 144; Potassium 3.3; Sodium 137  Recent Lipid Panel    Component Value Date/Time   CHOL 158 12/26/2018 0902   TRIG 35 12/26/2018 0902   HDL 69 12/26/2018 0902   CHOLHDL 2.3 12/26/2018 0902   LDLCALC 79 12/26/2018 0902    Physical Exam:    VS:  BP (!) 166/98 (BP Location: Left Arm, Patient Position: Sitting, Cuff Size: Large)   Pulse 76   Wt 246 lb 9.6 oz (111.9 kg)   SpO2 98%   BMI 41.04 kg/m     Wt Readings from Last 3 Encounters:  04/22/23 246 lb 9.6 oz (111.9 kg)  01/11/23 230 lb (104.3 kg)  04/16/22 229 lb 3.2 oz (104 kg)     GEN: Well nourished, well developed in no acute distress HEENT: Normal NECK: No JVD; No carotid bruits LYMPHATICS: No lymphadenopathy CARDIAC: S1S2 noted,RRR, no murmurs, rubs, gallops RESPIRATORY:  Clear to auscultation without rales, wheezing or rhonchi  ABDOMEN: Soft, non-tender, non-distended, +bowel sounds, no guarding. EXTREMITIES: No edema, No cyanosis, no clubbing MUSCULOSKELETAL:  No deformity  SKIN: Warm and dry NEUROLOGIC:  Alert and oriented x 3, non-focal PSYCHIATRIC:  Normal affect, good insight  ASSESSMENT:    1. Palpitations   2. Hypertension,  unspecified type   3. LVH (left ventricular hypertrophy)   4. Morbid obesity (HCC)    PLAN:    Hypertension Uncontrolled hypertension since 2016, currently on Amlodipine and Metoprolol. Echocardiogram shows severe left ventricular hypertrophy (LVH) with a septal thickness of 1.6, indicating significant cardiac damage from uncontrolled hypertension. Discussed the risk of heart failure if blood pressure is not controlled. -Increase Amlodipine to 10mg  daily. -Add Hydrochlorothiazide 12.5mg  daily. -Discontinue Metoprolol due to its limited efficacy in controlling blood pressure. -Plan to combine Amlodipine and Hydrochlorothiazide into one pill if this regimen is effective. -Follow-up with pharmacy in 2-4 weeks to assess blood pressure control. -Cardiology follow-up in 6-8 weeks.  Palpitations Reports of palpitations occurring 2-4 times daily. The cause is unclear. -Order a 14-day heart monitor to evaluate the cause of palpitations.  Prediabetes Patient aware of prediabetes diagnosis, reports efforts to limit sugar intake. -Encourage continued efforts to limit sugar intake and maintain a healthy diet.  Dietary habits -Advise to reduce  salt intake, particularly in cooking, to help control blood pressure.  The patient understands the need to lose weight with diet and exercise. We have discussed specific strategies for this.  The patient is in agreement with the above plan. The patient left the office in stable condition.  The patient will follow up in   Medication Adjustments/Labs and Tests Ordered: Current medicines are reviewed at length with the patient today.  Concerns regarding medicines are outlined above.  Orders Placed This Encounter  Procedures   AMB Referral to [facility]   LONG TERM MONITOR (3-14 DAYS)   Meds ordered this encounter  Medications   amLODipine (NORVASC) 10 MG tablet    Sig: Take 1 tablet (10 mg total) by mouth daily.    Dispense:  90 tablet     Refill:  3   hydrochlorothiazide (MICROZIDE) 12.5 MG capsule    Sig: Take 1 capsule (12.5 mg total) by mouth daily.    Dispense:  90 each    Refill:  0    Patient Instructions  Medication Instructions:  Your physician has recommended you make the following change in your medication:  INCREASE: Amlodipine 10 mg once daily DECREASE: Hydrochlorothiazide 12.5 mg once daily *If you need a refill on your cardiac medications before your next appointment, please call your pharmacy*   Testing/Procedures: ZIO XT- Long Term Monitor Instructions  Your physician has requested you wear a ZIO patch monitor for 14 days.  This is a single patch monitor. [employer] supplies one patch monitor per enrollment. Additional stickers are not available. Please do not apply patch if you will be having a Nuclear Stress Test,  Echocardiogram, Cardiac CT, MRI, or Chest Xray during the period you would be wearing the  monitor. The patch cannot be worn during these tests. You cannot remove and re-apply the  ZIO XT patch monitor.  Your ZIO patch monitor will be mailed 3 day USPS to your address on file. It may take 3-5 days  to receive your monitor after you have been enrolled.  Once you have received your monitor, please review the enclosed instructions. Your monitor  has already been registered assigning a specific monitor serial # to you.  Billing and Patient Assistance Program Information  We have supplied [employer] with any of your insurance information on file for billing purposes. [employer] offers a sliding scale Patient Assistance Program for patients that do not have  insurance, or whose insurance does not completely cover the cost of the ZIO monitor.  You must apply for the Patient Assistance Program to qualify for this discounted rate.  To apply, please call [employer] at [phone], select option 4, select option 2, ask to apply for  Patient Assistance Program. Meredeth Ide will ask your household income, and how  many people  are in your household. They will quote your out-of-pocket cost based on that information.  [employer] will also be able to set up a 53-month, interest-free payment plan if needed.  Applying the monitor   Shave hair from upper left chest.  Hold abrader disc by orange tab. Rub abrader in 40 strokes over the upper left chest as  indicated in your monitor instructions.  Clean area with 4 enclosed alcohol pads. Let dry.  Apply patch as indicated in monitor instructions. Patch will be placed under collarbone on left  side of chest with arrow pointing upward.  Rub patch adhesive wings for 2 minutes. Remove white label marked "1". Remove the white  label marked "2". Rub patch  adhesive wings for 2 additional minutes.  While looking in a mirror, press and release button in center of patch. A small green light will  flash 3-4 times. This will be your only indicator that the monitor has been turned on.  Do not shower for the first 24 hours. You may shower after the first 24 hours.  Press the button if you feel a symptom. You will hear a small click. Record Date, Time and  Symptom in the Patient Logbook.  When you are ready to remove the patch, follow instructions on the last 2 pages of Patient  Logbook. Stick patch monitor onto the last page of Patient Logbook.  Place Patient Logbook in the blue and white box. Use locking tab on box and tape box closed  securely. The blue and white box has prepaid postage on it. Please place it in the mailbox as  soon as possible. Your physician should have your test results approximately 7 days after the  monitor has been mailed back to [facility].  Call [facility] Customer Care at [phone] if you have questions regarding  your ZIO XT patch monitor. Call them immediately if you see an orange light blinking on your  monitor.  If your monitor falls off in less than 4 days, contact our Monitor department at [phone].  If your monitor becomes  loose or falls off after 4 days call [employer] at [phone] for  suggestions on securing your monitor    Follow-Up: At [facility], you and your health needs are our priority.  As part of our continuing mission to provide you with exceptional heart care, we have created designated Provider Care Teams.  These Care Teams include your primary Cardiologist (physician) and Advanced Practice Providers (APPs -  Physician Assistants and Nurse Practitioners) who all work together to provide you with the care you need, when you need it.   Your next appointment:   8 week(s)  Provider:   Thomasene Ripple, DO     Other Instructions Please see our pharmacy staff in 2-4 weeks.   Mediterranean Diet A Mediterranean diet is based on the traditions of countries on the [employer]. It focuses on eating more: Fruits and vegetables. Whole grains, beans, nuts, and seeds. Heart-healthy fats. These are fats that are good for your heart. It involves eating less: Dairy. Meat and eggs. Processed foods with added sugar, salt, and fat. This type of diet can help prevent certain conditions. It can also improve outcomes if you have a long-term (chronic) disease, such as kidney or heart disease. What are tips for following this plan? Reading food labels Check packaged foods for: The serving size. For foods such as rice and pasta, the serving size is the amount of cooked product, not dry. The total fat. Avoid foods with saturated fat or trans fat. Added sugars, such as corn syrup. Shopping  Try to have a balanced diet. Buy a variety of foods, such as: Fresh fruits and vegetables. You may be able to get these from local farmers markets. You can also buy them frozen. Grains, beans, nuts, and seeds. Some of these can be bought in bulk. Fresh seafood. Poultry and eggs. Low-fat dairy products. Buy whole ingredients instead of foods that have already been packaged. If you can't get fresh seafood,  buy precooked frozen shrimp or canned fish, such as tuna, salmon, or sardines. Stock your pantry so you always have certain foods on hand, such as olive oil, canned tuna, canned tomatoes, rice,  pasta, and beans. Cooking Cook foods with extra-virgin olive oil instead of using butter or other vegetable oils. Have meat as a side dish. Have vegetables or grains as your main dish. This means having meat in small portions or adding small amounts of meat to foods like pasta or stew. Use beans or vegetables instead of meat in common dishes like chili or lasagna. Try out different cooking methods. Try roasting, broiling, steaming, and sauting vegetables. Add frozen vegetables to soups, stews, pasta, or rice. Add nuts or seeds for added healthy fats and plant protein at each meal. You can add these to yogurt, salads, or vegetable dishes. Marinate fish or vegetables using olive oil, lemon juice, garlic, and fresh herbs. Meal planning Plan to eat a vegetarian meal one day each week. Try to work up to two vegetarian meals, if possible. Eat seafood two or more times a week. Have healthy snacks on hand. These may include: Vegetable sticks with hummus. Greek yogurt. Fruit and nut trail mix. Eat balanced meals. These should include: Fruit: 2-3 servings a day. Vegetables: 4-5 servings a day. Low-fat dairy: 2 servings a day. Fish, poultry, or lean meat: 1 serving a day. Beans and legumes: 2 or more servings a week. Nuts and seeds: 1-2 servings a day. Whole grains: 6-8 servings a day. Extra-virgin olive oil: 3-4 servings a day. Limit red meat and sweets to just a few servings a month. Lifestyle  Try to cook and eat meals with your family. Drink enough fluid to keep your pee (urine) pale yellow. Be active every day. This includes: Aerobic exercise, which is exercise that causes your heart to beat faster. Examples include running and swimming. Leisure activities like gardening, walking, or  housework. Get 7-8 hours of sleep each night. Drink red wine if your provider says you can. A glass of wine is 5 oz (150 mL). You may be allowed to have: Up to 1 glass a day if you're female and not pregnant. Up to 2 glasses a day if you're female. What foods should I eat? Fruits Apples. Apricots. Avocado. Berries. Bananas. Cherries. Dates. Figs. Grapes. Lemons. Melon. Oranges. Peaches. Plums. Pomegranate. Vegetables Artichokes. Beets. Broccoli. Cabbage. Carrots. Eggplant. Green beans. Chard. Kale. Spinach. Onions. Leeks. Peas. Squash. Tomatoes. Peppers. Radishes. Grains Whole-grain pasta. Brown rice. Bulgur wheat. Polenta. Couscous. Whole-wheat bread. Orpah Cobb. Meats and other proteins Beans. Almonds. Sunflower seeds. Pine nuts. Peanuts. Cod. Salmon. Scallops. Shrimp. Tuna. Tilapia. Clams. Oysters. Eggs. Chicken or [place] without skin. Dairy Low-fat milk. Cheese. Greek yogurt. Fats and oils Extra-virgin olive oil. Avocado oil. Grapeseed oil. Beverages Water. Red wine. Herbal tea. Sweets and desserts Greek yogurt with honey. Baked apples. Poached pears. Trail mix. Seasonings and condiments Basil. Cilantro. Coriander. Cumin. Mint. Parsley. Sage. Rosemary. Tarragon. Garlic. Oregano. Thyme. Pepper. Balsamic vinegar. Tahini. Hummus. Tomato sauce. Olives. Mushrooms. The items listed above may not be all the foods and drinks you can have. Talk to a dietitian to learn more. What foods should I limit? This is a list of foods that should be eaten rarely. Fruits Fruit canned in syrup. Vegetables Deep-fried potatoes, like [place] fries. Grains Packaged pasta or rice dishes. Cereal with added sugar. Snacks with added sugar. Meats and other proteins Beef. Pork. Lamb. Chicken or [place] with skin. Hot dogs. Tomasa Blase. Dairy Ice cream. Sour cream. Whole milk. Fats and oils Butter. Canola oil. Vegetable oil. Beef fat (tallow). Lard. Beverages Juice. Sugar-sweetened soft drinks. Beer. Liquor  and spirits. Sweets and desserts Cookies. Cakes. Pies. Candy. Seasonings  and condiments Mayonnaise. Pre-made sauces and marinades. The items listed above may not be all the foods and drinks you should limit. Talk to a dietitian to learn more. Where to find more information American Heart Association (AHA): heart.org This information is not intended to replace advice given to you by your health care provider. Make sure you discuss any questions you have with your health care provider. Document Revised: 08/19/2022 Document Reviewed: 08/19/2022 Elsevier Patient Education  2024 Elsevier Inc.     Adopting a Healthy Lifestyle.  Know what a healthy weight is for you (roughly BMI <25) and aim to maintain this   Aim for 7+ servings of fruits and vegetables daily   65-80+ fluid ounces of water or unsweet tea for healthy kidneys   Limit to max 1 drink of alcohol per day; avoid smoking/tobacco   Limit animal fats in diet for cholesterol and heart health - choose grass fed whenever available   Avoid highly processed foods, and foods high in saturated/trans fats   Aim for low stress - take time to unwind and care for your mental health   Aim for 150 min of moderate intensity exercise weekly for heart health, and weights twice weekly for bone health   Aim for 7-9 hours of sleep daily   When it comes to diets, agreement about the perfect plan isnt easy to find, even among the experts. Experts at the [facility] [employer] developed an idea known as the Healthy Eating Plate. Just imagine a plate divided into logical, healthy portions.   The emphasis is on diet quality:   Load up on vegetables and fruits - one-half of your plate: Aim for color and variety, and remember that potatoes dont count.   Go for whole grains - one-quarter of your plate: Whole wheat, barley, wheat berries, quinoa, oats, brown rice, and foods made with them. If you want pasta, go with whole wheat pasta.    Protein power - one-quarter of your plate: Fish, chicken, beans, and nuts are all healthy, versatile protein sources. Limit red meat.   The diet, however, does go beyond the plate, offering a few other suggestions.   Use healthy plant oils, such as olive, canola, soy, corn, sunflower and peanut. Check the labels, and avoid partially hydrogenated oil, which have unhealthy trans fats.   If youre thirsty, drink water. Coffee and tea are good in moderation, but skip sugary drinks and limit milk and dairy products to one or two daily servings.   The type of carbohydrate in the diet is more important than the amount. Some sources of carbohydrates, such as vegetables, fruits, whole grains, and beans-are healthier than others.   Finally, stay active  Signed, Thomasene Ripple, DO  04/22/2023 4:11 PM    [facility]

## 2023-04-23 ENCOUNTER — Ambulatory Visit: Payer: Commercial Managed Care - HMO | Attending: Cardiology

## 2023-04-23 DIAGNOSIS — R002 Palpitations: Secondary | ICD-10-CM

## 2023-04-23 NOTE — Progress Notes (Unsigned)
Enrolled patient for a 14 day Zio XT  monitor to be mailed to patients home  °

## 2023-04-29 DIAGNOSIS — R002 Palpitations: Secondary | ICD-10-CM | POA: Diagnosis not present

## 2023-05-13 ENCOUNTER — Encounter: Payer: Self-pay | Admitting: Pharmacist

## 2023-05-13 ENCOUNTER — Ambulatory Visit: Payer: Commercial Managed Care - HMO | Attending: Cardiology | Admitting: Pharmacist

## 2023-05-13 VITALS — BP 135/83 | HR 82

## 2023-05-13 DIAGNOSIS — I10 Essential (primary) hypertension: Secondary | ICD-10-CM

## 2023-05-13 NOTE — Patient Instructions (Addendum)
It was nice meeting you today  We would like your blood pressure to be less than 130/80  Please continue: Amlodipine 10mg  daily Hydrochlorothiazide 12.5mg  daily Metoprolol 25mg  daily  I would like to check your lab work this morning to make sure you are tolerating the hydrochlorothiazide.  If it looks good, I will call you tomorrow and increase hydrochlorothiazide to 25mg   Please let us know if you have any questions  Laural Golden, PharmD, BCACP, CDCES, CPP [address] [place], [place], [address] Phone: [phone], Fax: [phone]

## 2023-05-13 NOTE — Progress Notes (Signed)
Patient ID: Jamie Mcbride                 DOB: 1975-08-03                      MRN: [id number]     HPI: Jamie Mcbride is a 47 y.o. female referred by Dr. Servando Salina to HTN clinic. PMH is significant for HTN, hypothyroidism, hyperprolactinemia, and pre DM.   At first visit with Dr Servando Salina, patient;s amlodipine was increased and was started on low dose hydrochlorothiazide.  Patient presents today to discuss HTN management. Reports no side effects to increase in amlodipine or addition of hydrochlorothiazide. Has not been checking BP at home.   Denies chest pain, swelling, SOB.  Needs updated BMP.  Current HTN meds:  Amlodipine 10mg  daily  in mogning Hydrochlorothiazide 12.5mg  daily morning Metoprolol 25mg  daily  BP goal: <130/80   Wt Readings from Last 3 Encounters:  04/22/23 246 lb 9.6 oz (111.9 kg)  01/11/23 230 lb (104.3 kg)  04/16/22 229 lb 3.2 oz (104 kg)   BP Readings from Last 3 Encounters:  04/22/23 (!) 164/80  01/11/23 (!) 149/87  04/16/22 (!) 154/97   Pulse Readings from Last 3 Encounters:  04/22/23 76  01/11/23 (!) 56  04/16/22 85    Renal function: CrCl cannot be calculated (Patient's most recent lab result is older than the maximum 21 days allowed.).  Past Medical History:  Diagnosis Date   Elevated prolactin level    DX 2011   History of concussion    07-17-2013 W/ LOC (NEGATIVE CT)  NO RESIDUAL   Hypertension    Pelvic adhesions     Current Outpatient Medications on File Prior to Visit  Medication Sig Dispense Refill   amLODipine (NORVASC) 10 MG tablet Take 1 tablet (10 mg total) by mouth daily. 90 tablet 3   hydrochlorothiazide (MICROZIDE) 12.5 MG capsule Take 1 capsule (12.5 mg total) by mouth daily. 90 each 0   levothyroxine (SYNTHROID) 25 MCG tablet Take 25 mcg by mouth daily before breakfast.     meclizine (ANTIVERT) 25 MG tablet Take 1 tablet (25 mg total) by mouth 3 (three) times daily as needed for dizziness. 30 tablet 0   metoprolol succinate  (TOPROL-XL) 25 MG 24 hr tablet Take 1 tablet (25 mg total) by mouth daily. 30 tablet 0   PRESCRIPTION MEDICATION Name? - hormones     Vitamin D, Ergocalciferol, (DRISDOL) 1.25 MG (50000 UT) CAPS capsule Take 1 capsule (50,000 Units total) by mouth every 7 (seven) days. 12 capsule 0   No current facility-administered medications on file prior to visit.    No Known Allergies   Assessment/Plan:  1. Hypertension -    Patient BP in room 135/83 which is improved but still above goal of <130/80. Will update BMP today to monitor renal function and electrolytes. If normal, will increase hydrochlorothiazide to 25mg  daily. Patient will follow up with Dr Servando Salina in 1 month.  Continue: Amlodipine 10mg  daily Hydrochlorothiazide 12.5mg  daily Check BMP  Laural Golden, PharmD, BCACP, CDCES, CPP [address], [address] [place], [place], [address] Phone: [phone], Fax: [phone]

## 2023-05-14 ENCOUNTER — Other Ambulatory Visit: Payer: Self-pay | Admitting: Pharmacist

## 2023-05-14 DIAGNOSIS — I10 Essential (primary) hypertension: Secondary | ICD-10-CM

## 2023-05-14 LAB — BASIC METABOLIC PANEL
BUN/Creatinine Ratio: 19 (ref 9–23)
BUN: 13 mg/dL (ref 6–24)
CO2: 24 mmol/L (ref 20–29)
Calcium: 9.6 mg/dL (ref 8.7–10.2)
Chloride: 106 mmol/L (ref 96–106)
Creatinine, Ser: 0.67 mg/dL (ref 0.57–1.00)
Glucose: 78 mg/dL (ref 70–99)
Potassium: 4.2 mmol/L (ref 3.5–5.2)
Sodium: 143 mmol/L (ref 134–144)
eGFR: 108 mL/min/{1.73_m2} (ref >59)

## 2023-05-14 MED ORDER — HYDROCHLOROTHIAZIDE 25 MG PO TABS: 25.0000 mg | ORAL_TABLET | Freq: Every day | ORAL | 1 refills | Status: AC

## 2023-05-14 NOTE — Progress Notes (Signed)
Hydrochlorothiazide increase

## 2023-06-17 ENCOUNTER — Other Ambulatory Visit: Payer: Self-pay | Admitting: Physician Assistant

## 2023-06-17 DIAGNOSIS — Z1231 Encounter for screening mammogram for malignant neoplasm of breast: Secondary | ICD-10-CM

## 2023-06-21 ENCOUNTER — Ambulatory Visit: Payer: Commercial Managed Care - HMO | Attending: Cardiology | Admitting: Cardiology

## 2023-06-21 ENCOUNTER — Encounter: Payer: Self-pay | Admitting: Cardiology

## 2023-06-21 DIAGNOSIS — I471 Supraventricular tachycardia, unspecified: Secondary | ICD-10-CM

## 2023-06-21 DIAGNOSIS — R7303 Prediabetes: Secondary | ICD-10-CM

## 2023-06-21 DIAGNOSIS — I10 Essential (primary) hypertension: Secondary | ICD-10-CM

## 2023-06-21 MED ORDER — METOPROLOL SUCCINATE ER 25 MG PO TB24: 25.0000 mg | ORAL_TABLET | Freq: Two times a day (BID) | ORAL | 3 refills | Status: AC

## 2023-06-21 NOTE — Progress Notes (Signed)
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 Discuss with patient their interest in participating in the free health coaching program and refer to REF 2201/Care Navigation.

## 2023-06-21 NOTE — Patient Instructions (Addendum)
Medication Instructions:  Your physician has recommended you make the following change in your medication:  INCREASE: Metoprolol succinate (Toprol-XL) 25 mg twice daily  *If you need a refill on your cardiac medications before your next appointment, please call your pharmacy*  Follow-Up: At [facility], you and your health needs are our priority.  As part of our continuing mission to provide you with exceptional heart care, we have created designated Provider Care Teams.  These Care Teams include your primary Cardiologist (physician) and Advanced Practice Providers (APPs -  Physician Assistants and Nurse Practitioners) who all work together to provide you with the care you need, when you need it.  Your next appointment:   16 week(s)  Provider:   Thomasene Ripple, DO     Other Instructions:  Diabetes Mellitus and Nutrition, Adult When you have diabetes, or diabetes mellitus, it is very important to have healthy eating habits because your blood sugar (glucose) levels are greatly affected by what you eat and drink. Eating healthy foods in the right amounts, at about the same times every day, can help you: Manage your blood glucose. Lower your risk of heart disease. Improve your blood pressure. Reach or maintain a healthy weight. What can affect my meal plan? Every person with diabetes is different, and each person has different needs for a meal plan. Your health care provider may recommend that you work with a dietitian to make a meal plan that is best for you. Your meal plan may vary depending on factors such as: The calories you need. The medicines you take. Your weight. Your blood glucose, blood pressure, and cholesterol levels. Your activity level. Other health conditions you have, such as heart or kidney disease. How do carbohydrates affect me? Carbohydrates, also called carbs, affect your blood glucose level more than any other type of food. Eating carbs raises the amount of  glucose in your blood. It is important to know how many carbs you can safely have in each meal. This is different for every person. Your dietitian can help you calculate how many carbs you should have at each meal and for each snack. How does alcohol affect me? Alcohol can cause a decrease in blood glucose (hypoglycemia), especially if you use insulin or take certain diabetes medicines by mouth. Hypoglycemia can be a life-threatening condition. Symptoms of hypoglycemia, such as sleepiness, dizziness, and confusion, are similar to symptoms of having too much alcohol. Do not drink alcohol if: Your health care provider tells you not to drink. You are pregnant, may be pregnant, or are planning to become pregnant. If you drink alcohol: Limit how much you have to: 0-1 drink a day for women. 0-2 drinks a day for men. Know how much alcohol is in your drink. In the [place] one drink equals one 12 oz bottle of beer (355 mL), one 5 oz glass of wine (148 mL), or one 1 oz glass of hard liquor (44 mL). Keep yourself hydrated with water, diet soda, or unsweetened iced tea. Keep in mind that regular soda, juice, and other mixers may contain a lot of sugar and must be counted as carbs. What are tips for following this plan?  Reading food labels Start by checking the serving size on the Nutrition Facts label of packaged foods and drinks. The number of calories and the amount of carbs, fats, and other nutrients listed on the label are based on one serving of the item. Many items contain more than one serving per package. Check  the total grams (g) of carbs in one serving. Check the number of grams of saturated fats and trans fats in one serving. Choose foods that have a low amount or none of these fats. Check the number of milligrams (mg) of salt (sodium) in one serving. Most people should limit total sodium intake to less than 2,300 mg per day. Always check the nutrition information of foods labeled as "low-fat" or  "nonfat." These foods may be higher in added sugar or refined carbs and should be avoided. Talk to your dietitian to identify your daily goals for nutrients listed on the label. Shopping Avoid buying canned, pre-made, or processed foods. These foods tend to be high in fat, sodium, and added sugar. Shop around the outside edge of the grocery store. This is where you will most often find fresh fruits and vegetables, bulk grains, fresh meats, and fresh dairy products. Cooking Use low-heat cooking methods, such as baking, instead of high-heat cooking methods, such as deep frying. Cook using healthy oils, such as olive, canola, or sunflower oil. Avoid cooking with butter, cream, or high-fat meats. Meal planning Eat meals and snacks regularly, preferably at the same times every day. Avoid going long periods of time without eating. Eat foods that are high in fiber, such as fresh fruits, vegetables, beans, and whole grains. Eat 4-6 oz (112-168 g) of lean protein each day, such as lean meat, chicken, fish, eggs, or tofu. One ounce (oz) (28 g) of lean protein is equal to: 1 oz (28 g) of meat, chicken, or fish. 1 egg.  cup (62 g) of tofu. Eat some foods each day that contain healthy fats, such as avocado, nuts, seeds, and fish. What foods should I eat? Fruits Berries. Apples. Oranges. Peaches. Apricots. Plums. Grapes. Mangoes. Papayas. Pomegranates. Kiwi. Cherries. Vegetables Leafy greens, including lettuce, spinach, kale, chard, collard greens, mustard greens, and cabbage. Beets. Cauliflower. Broccoli. Carrots. Green beans. Tomatoes. Peppers. Onions. Cucumbers. Brussels sprouts. Grains Whole grains, such as whole-wheat or whole-grain bread, crackers, tortillas, cereal, and pasta. Unsweetened oatmeal. Quinoa. Brown or wild rice. Meats and other proteins Seafood. Poultry without skin. Lean cuts of poultry and beef. Tofu. Nuts. Seeds. Dairy Low-fat or fat-free dairy products such as milk, yogurt, and  cheese. The items listed above may not be a complete list of foods and beverages you can eat and drink. Contact a dietitian for more information. What foods should I avoid? Fruits Fruits canned with syrup. Vegetables Canned vegetables. Frozen vegetables with butter or cream sauce. Grains Refined white flour and flour products such as bread, pasta, snack foods, and cereals. Avoid all processed foods. Meats and other proteins Fatty cuts of meat. Poultry with skin. Breaded or fried meats. Processed meat. Avoid saturated fats. Dairy Full-fat yogurt, cheese, or milk. Beverages Sweetened drinks, such as soda or iced tea. The items listed above may not be a complete list of foods and beverages you should avoid. Contact a dietitian for more information. Questions to ask a health care provider Do I need to meet with a certified diabetes care and education specialist? Do I need to meet with a dietitian? What number can I call if I have questions? When are the best times to check my blood glucose? Where to find more information: American Diabetes Association: diabetes.org Academy of Nutrition and Dietetics: eatright[employer] of Diabetes and Digestive and Kidney Diseases: [employer]si Association of Diabetes Care & Education Specialists: diabeteseducator.org Summary It is important to have healthy eating habits because your blood sugar (  glucose) levels are greatly affected by what you eat and drink. It is important to use alcohol carefully. A healthy meal plan will help you manage your blood glucose and lower your risk of heart disease. Your health care provider may recommend that you work with a dietitian to make a meal plan that is best for you. This information is not intended to replace advice given to you by your health care provider. Make sure you discuss any questions you have with your health care provider. Document Revised: 12/08/2019 Document Reviewed: 12/09/2019 Elsevier  Patient Education  2024 [employer].

## 2023-06-22 NOTE — Progress Notes (Signed)
Cardiology Office Note:    Date:  06/22/2023   ID:  Jamie Mcbride, DOB 1975/08/10, MRN [id number]  PCP:  Hillery Aldo, PA-C  Cardiologist:  Thomasene Ripple, DO  Electrophysiologist:  None   Referring MD: Juliet Rude*   " I am here because of my echo results"  History of Present Illness:    Jamie Mcbride is a 48 y.o. female with a history of hypertension since 2016,and PSVT.  Today she presents with persistently elevated blood pressure despite adherence to amlodipine 10mg  and hydrochlorothiazide 25mg . She reports recent blood pressure readings of 130/88 and 140/90, taken manually and electronically. She expresses frustration and concern over her inability to achieve consistent blood pressure control despite medication adherence.  In addition to hypertension, the patient experiences palpitations, which have been evaluated with a 14-day heart monitor. The monitor revealed 14 episodes of supraventricular tachycardia (SVT), with heart rates reaching up to 200 beats per minute. The patient is currently on metoprolol for this condition.  The patient also has a recent diagnosis of diabetes, with a hemoglobin A1c now in the diabetic range. She acknowledges the need for dietary changes, particularly reducing intake of high-carbohydrate foods common in her cultural diet, such as fufu and rice.   Past Medical History:  Diagnosis Date   Elevated prolactin level    DX 2011   History of concussion    07-17-2013 W/ LOC (NEGATIVE CT)  NO RESIDUAL   Hypertension    Pelvic adhesions     Past Surgical History:  Procedure Laterality Date   APPENDECTOMY  2001   BREAST BIOPSY Left 05/18/2022   [place] LT BREAST BX W LOC DEV 1ST LESION IMG BX SPEC US GUIDE 05/18/2022 [facility]   LAPAROSCOPY N/A 10/11/2015   Procedure: LAPROSCOPIC LYSIS OF ADHESION WITH LYSIS AND EXCISION OF RIGHT OVARY ADHESION;  Surgeon: Fermin Schwab, MD;  Location: [facility];  Service:  Gynecology;  Laterality: N/A;    Current Medications: Current Meds  Medication Sig   amLODipine (NORVASC) 10 MG tablet Take 1 tablet (10 mg total) by mouth daily.   cabergoline (DOSTINEX) 0.5 MG tablet Take 0.5 mg by mouth 2 (two) times a week.   hydrochlorothiazide (HYDRODIURIL) 25 MG tablet Take 1 tablet (25 mg total) by mouth daily.   levothyroxine (SYNTHROID) 25 MCG tablet Take 25 mcg by mouth daily before breakfast.   meclizine (ANTIVERT) 25 MG tablet Take 1 tablet (25 mg total) by mouth 3 (three) times daily as needed for dizziness.   metoprolol succinate (TOPROL XL) 25 MG 24 hr tablet Take 1 tablet (25 mg total) by mouth in the morning and at bedtime.   PRESCRIPTION MEDICATION Name? - hormones   Vitamin D, Ergocalciferol, (DRISDOL) 1.25 MG (50000 UT) CAPS capsule Take 1 capsule (50,000 Units total) by mouth every 7 (seven) days.   [DISCONTINUED] metoprolol succinate (TOPROL-XL) 25 MG 24 hr tablet Take 1 tablet (25 mg total) by mouth daily.     Allergies:   Patient has no known allergies.   Social History   Socioeconomic History   Marital status: Married    Spouse name: Not on file   Number of children: Not on file   Years of education: Not on file   Highest education level: Not on file  Occupational History   Not on file  Tobacco Use   Smoking status: Never   Smokeless tobacco: Never  Vaping Use   Vaping status: Never Used  Substance and Sexual Activity  Alcohol use: No   Drug use: No   Sexual activity: Yes    Partners: Male  Other Topics Concern   Not on file  Social History Narrative   Not on file   Social Drivers of Health   Financial Resource Strain: Not on file  Food Insecurity: Not on file  Transportation Needs: Not on file  Physical Activity: Not on file  Stress: Not on file  Social Connections: Not on file     Family History: The patient's family history includes Diabetes in her father; Hypertension in her father. There is no history of  Other.  ROS:   Review of Systems  Constitution: Negative for decreased appetite, fever and weight gain.  HENT: Negative for congestion, ear discharge, hoarse voice and sore throat.   Eyes: Negative for discharge, redness, vision loss in right eye and visual halos.  Cardiovascular: Negative for chest pain, dyspnea on exertion, leg swelling, orthopnea and palpitations.  Respiratory: Negative for cough, hemoptysis, shortness of breath and snoring.   Endocrine: Negative for heat intolerance and polyphagia.  Hematologic/Lymphatic: Negative for bleeding problem. Does not bruise/bleed easily.  Skin: Negative for flushing, nail changes, rash and suspicious lesions.  Musculoskeletal: Negative for arthritis, joint pain, muscle cramps, myalgias, neck pain and stiffness.  Gastrointestinal: Negative for abdominal pain, bowel incontinence, diarrhea and excessive appetite.  Genitourinary: Negative for decreased libido, genital sores and incomplete emptying.  Neurological: Negative for brief paralysis, focal weakness, headaches and loss of balance.  Psychiatric/Behavioral: Negative for altered mental status, depression and suicidal ideas.  Allergic/Immunologic: Negative for HIV exposure and persistent infections.    EKGs/Labs/Other Studies Reviewed:    The following studies were reviewed today:   EKG:  The ekg ordered today demonstrates   Recent Labs: 01/11/2023: Hemoglobin 13.2; Platelets 144 05/13/2023: BUN 13; Creatinine, Ser 0.67; Potassium 4.2; Sodium 143  Recent Lipid Panel    Component Value Date/Time   CHOL 158 12/26/2018 0902   TRIG 35 12/26/2018 0902   HDL 69 12/26/2018 0902   CHOLHDL 2.3 12/26/2018 0902   LDLCALC 79 12/26/2018 0902    Physical Exam:    VS:  BP (!) 142/90   Pulse 83   Ht 5\' 3"  (1.6 m)   Wt 241 lb 6.4 oz (109.5 kg)   SpO2 99%   BMI 42.76 kg/m     Wt Readings from Last 3 Encounters:  06/21/23 241 lb 6.4 oz (109.5 kg)  04/22/23 246 lb 9.6 oz (111.9 kg)   01/11/23 230 lb (104.3 kg)     GEN: Well nourished, well developed in no acute distress HEENT: Normal NECK: No JVD; No carotid bruits LYMPHATICS: No lymphadenopathy CARDIAC: S1S2 noted,RRR, no murmurs, rubs, gallops RESPIRATORY:  Clear to auscultation without rales, wheezing or rhonchi  ABDOMEN: Soft, non-tender, non-distended, +bowel sounds, no guarding. EXTREMITIES: No edema, No cyanosis, no clubbing MUSCULOSKELETAL:  No deformity  SKIN: Warm and dry NEUROLOGIC:  Alert and oriented x 3, non-focal PSYCHIATRIC:  Normal affect, good insight  ASSESSMENT:    1. Morbid obesity (HCC)   2. Hypertension, unspecified type   3. PSVT (paroxysmal supraventricular tachycardia) (HCC)   4. Prediabetes    PLAN:    Hypertension Despite adherence to Amlodipine 10mg  and Hydrochlorothiazide 25mg , blood pressure remains slightly elevated. -Continue current medications.  Supraventricular Tachycardia (SVT) Patient experiencing palpitations due to SVT as evidenced by heart monitor results. Currently on Metoprolol. -Increase Metoprolol to 50mg  (25mg  in the morning and 25mg  at night) to control heart rate and palpitations.  Prediabetes Recent Hemoglobin A1c indicates uncontrolled diabetes. -Refer to health coach for dietary counseling. -Encourage patient to attend health classes for diabetes management.  General Health Maintenance -Encourage patient to attend mammogram screening event. -Check thyroid function tests as she is still pending.  The patient is in agreement with the above plan. The patient left the office in stable condition.  The patient will follow up in   Medication Adjustments/Labs and Tests Ordered: Current medicines are reviewed at length with the patient today.  Concerns regarding medicines are outlined above.  Orders Placed This Encounter  Procedures   Referral to HRT/VAS Care Navigation   Meds ordered this encounter  Medications   metoprolol succinate (TOPROL XL) 25  MG 24 hr tablet    Sig: Take 1 tablet (25 mg total) by mouth in the morning and at bedtime.    Dispense:  180 tablet    Refill:  3    Patient Instructions  Medication Instructions:  Your physician has recommended you make the following change in your medication:  INCREASE: Metoprolol succinate (Toprol-XL) 25 mg twice daily  *If you need a refill on your cardiac medications before your next appointment, please call your pharmacy*  Follow-Up: At [facility], you and your health needs are our priority.  As part of our continuing mission to provide you with exceptional heart care, we have created designated Provider Care Teams.  These Care Teams include your primary Cardiologist (physician) and Advanced Practice Providers (APPs -  Physician Assistants and Nurse Practitioners) who all work together to provide you with the care you need, when you need it.  Your next appointment:   16 week(s)  Provider:   Thomasene Ripple, DO     Other Instructions:  Diabetes Mellitus and Nutrition, Adult When you have diabetes, or diabetes mellitus, it is very important to have healthy eating habits because your blood sugar (glucose) levels are greatly affected by what you eat and drink. Eating healthy foods in the right amounts, at about the same times every day, can help you: Manage your blood glucose. Lower your risk of heart disease. Improve your blood pressure. Reach or maintain a healthy weight. What can affect my meal plan? Every person with diabetes is different, and each person has different needs for a meal plan. Your health care provider may recommend that you work with a dietitian to make a meal plan that is best for you. Your meal plan may vary depending on factors such as: The calories you need. The medicines you take. Your weight. Your blood glucose, blood pressure, and cholesterol levels. Your activity level. Other health conditions you have, such as heart or kidney disease. How do  carbohydrates affect me? Carbohydrates, also called carbs, affect your blood glucose level more than any other type of food. Eating carbs raises the amount of glucose in your blood. It is important to know how many carbs you can safely have in each meal. This is different for every person. Your dietitian can help you calculate how many carbs you should have at each meal and for each snack. How does alcohol affect me? Alcohol can cause a decrease in blood glucose (hypoglycemia), especially if you use insulin or take certain diabetes medicines by mouth. Hypoglycemia can be a life-threatening condition. Symptoms of hypoglycemia, such as sleepiness, dizziness, and confusion, are similar to symptoms of having too much alcohol. Do not drink alcohol if: Your health care provider tells you not to drink. You are pregnant, may be pregnant,  or are planning to become pregnant. If you drink alcohol: Limit how much you have to: 0-1 drink a day for women. 0-2 drinks a day for men. Know how much alcohol is in your drink. In the [place] one drink equals one 12 oz bottle of beer (355 mL), one 5 oz glass of wine (148 mL), or one 1 oz glass of hard liquor (44 mL). Keep yourself hydrated with water, diet soda, or unsweetened iced tea. Keep in mind that regular soda, juice, and other mixers may contain a lot of sugar and must be counted as carbs. What are tips for following this plan?  Reading food labels Start by checking the serving size on the Nutrition Facts label of packaged foods and drinks. The number of calories and the amount of carbs, fats, and other nutrients listed on the label are based on one serving of the item. Many items contain more than one serving per package. Check the total grams (g) of carbs in one serving. Check the number of grams of saturated fats and trans fats in one serving. Choose foods that have a low amount or none of these fats. Check the number of milligrams (mg) of salt (sodium) in  one serving. Most people should limit total sodium intake to less than 2,300 mg per day. Always check the nutrition information of foods labeled as "low-fat" or "nonfat." These foods may be higher in added sugar or refined carbs and should be avoided. Talk to your dietitian to identify your daily goals for nutrients listed on the label. Shopping Avoid buying canned, pre-made, or processed foods. These foods tend to be high in fat, sodium, and added sugar. Shop around the outside edge of the grocery store. This is where you will most often find fresh fruits and vegetables, bulk grains, fresh meats, and fresh dairy products. Cooking Use low-heat cooking methods, such as baking, instead of high-heat cooking methods, such as deep frying. Cook using healthy oils, such as olive, canola, or sunflower oil. Avoid cooking with butter, cream, or high-fat meats. Meal planning Eat meals and snacks regularly, preferably at the same times every day. Avoid going long periods of time without eating. Eat foods that are high in fiber, such as fresh fruits, vegetables, beans, and whole grains. Eat 4-6 oz (112-168 g) of lean protein each day, such as lean meat, chicken, fish, eggs, or tofu. One ounce (oz) (28 g) of lean protein is equal to: 1 oz (28 g) of meat, chicken, or fish. 1 egg.  cup (62 g) of tofu. Eat some foods each day that contain healthy fats, such as avocado, nuts, seeds, and fish. What foods should I eat? Fruits Berries. Apples. Oranges. Peaches. Apricots. Plums. Grapes. Mangoes. Papayas. Pomegranates. Kiwi. Cherries. Vegetables Leafy greens, including lettuce, spinach, kale, chard, collard greens, mustard greens, and cabbage. Beets. Cauliflower. Broccoli. Carrots. Green beans. Tomatoes. Peppers. Onions. Cucumbers. Brussels sprouts. Grains Whole grains, such as whole-wheat or whole-grain bread, crackers, tortillas, cereal, and pasta. Unsweetened oatmeal. Quinoa. Brown or wild rice. Meats and  other proteins Seafood. Poultry without skin. Lean cuts of poultry and beef. Tofu. Nuts. Seeds. Dairy Low-fat or fat-free dairy products such as milk, yogurt, and cheese. The items listed above may not be a complete list of foods and beverages you can eat and drink. Contact a dietitian for more information. What foods should I avoid? Fruits Fruits canned with syrup. Vegetables Canned vegetables. Frozen vegetables with butter or cream sauce. Grains Refined white flour and flour products  such as bread, pasta, snack foods, and cereals. Avoid all processed foods. Meats and other proteins Fatty cuts of meat. Poultry with skin. Breaded or fried meats. Processed meat. Avoid saturated fats. Dairy Full-fat yogurt, cheese, or milk. Beverages Sweetened drinks, such as soda or iced tea. The items listed above may not be a complete list of foods and beverages you should avoid. Contact a dietitian for more information. Questions to ask a health care provider Do I need to meet with a certified diabetes care and education specialist? Do I need to meet with a dietitian? What number can I call if I have questions? When are the best times to check my blood glucose? Where to find more information: American Diabetes Association: diabetes.org Academy of Nutrition and Dietetics: eatright.[employer] of Diabetes and Digestive and Kidney Diseases: StageSync.si Association of Diabetes Care & Education Specialists: diabeteseducator.org Summary It is important to have healthy eating habits because your blood sugar (glucose) levels are greatly affected by what you eat and drink. It is important to use alcohol carefully. A healthy meal plan will help you manage your blood glucose and lower your risk of heart disease. Your health care provider may recommend that you work with a dietitian to make a meal plan that is best for you. This information is not intended to replace advice given to you by your  health care provider. Make sure you discuss any questions you have with your health care provider. Document Revised: 12/08/2019 Document Reviewed: 12/09/2019 Elsevier Patient Education  2024 Elsevier Inc.    Adopting a Healthy Lifestyle.  Know what a healthy weight is for you (roughly BMI <25) and aim to maintain this   Aim for 7+ servings of fruits and vegetables daily   65-80+ fluid ounces of water or unsweet tea for healthy kidneys   Limit to max 1 drink of alcohol per day; avoid smoking/tobacco   Limit animal fats in diet for cholesterol and heart health - choose grass fed whenever available   Avoid highly processed foods, and foods high in saturated/trans fats   Aim for low stress - take time to unwind and care for your mental health   Aim for 150 min of moderate intensity exercise weekly for heart health, and weights twice weekly for bone health   Aim for 7-9 hours of sleep daily   When it comes to diets, agreement about the perfect plan isnt easy to find, even among the experts. Experts at the [facility] [employer] developed an idea known as the Healthy Eating Plate. Just imagine a plate divided into logical, healthy portions.   The emphasis is on diet quality:   Load up on vegetables and fruits - one-half of your plate: Aim for color and variety, and remember that potatoes dont count.   Go for whole grains - one-quarter of your plate: Whole wheat, barley, wheat berries, quinoa, oats, brown rice, and foods made with them. If you want pasta, go with whole wheat pasta.   Protein power - one-quarter of your plate: Fish, chicken, beans, and nuts are all healthy, versatile protein sources. Limit red meat.   The diet, however, does go beyond the plate, offering a few other suggestions.   Use healthy plant oils, such as olive, canola, soy, corn, sunflower and peanut. Check the labels, and avoid partially hydrogenated oil, which have unhealthy trans fats.   If  youre thirsty, drink water. Coffee and tea are good in moderation, but skip sugary drinks and  limit milk and dairy products to one or two daily servings.   The type of carbohydrate in the diet is more important than the amount. Some sources of carbohydrates, such as vegetables, fruits, whole grains, and beans-are healthier than others.   Finally, stay active  Signed, Thomasene Ripple, DO  06/22/2023 10:45 AM     [facility]

## 2023-06-24 ENCOUNTER — Telehealth: Payer: Self-pay

## 2023-06-24 DIAGNOSIS — Z0000 Encounter for general adult medical examination without abnormal findings: Secondary | ICD-10-CM

## 2023-06-24 NOTE — Telephone Encounter (Signed)
Called patient per health coaching referral for healthy eating and physical activity. Patient expressed interest in participating in health coaching and has been scheduled for her initial health coaching appointment on 2/10 at 3:00pm. Patient was sent an email with contact information.   Renaee Munda, MS, ERHD, [facility]  Care Guide, Health & Wellness Coach [address] [place] [address] Telephone: [phone] Email: [email or web address]

## 2023-06-27 ENCOUNTER — Ambulatory Visit: Payer: Commercial Managed Care - HMO

## 2023-07-01 ENCOUNTER — Telehealth: Payer: Self-pay | Admitting: Licensed Clinical Social Worker

## 2023-07-01 ENCOUNTER — Ambulatory Visit: Payer: Commercial Managed Care - HMO

## 2023-07-01 NOTE — Telephone Encounter (Signed)
 CSW contacted patient to inform that Amy Lee-Health Coach is out of the office unexpectedly today and appointment will be cancelled. Health Coach will contact patient to reschedule visit. Aubry Blase, LCSW, CCSW-MCS [phone]

## 2023-07-19 ENCOUNTER — Telehealth: Payer: Self-pay

## 2023-07-19 DIAGNOSIS — Z0000 Encounter for general adult medical examination without abnormal findings: Secondary | ICD-10-CM

## 2023-07-19 NOTE — Telephone Encounter (Signed)
 Called patient to reschedule health coaching appointment due to being out of office on 2/10. Patient is available on 3/6. Patient has been scheduled for 3/6 at 1:30pm for an in-person health coaching session at [facility]   Renaee Munda, MS, ERHD, [facility]  Care Guide, Health & Wellness Coach [address], [address] [place] [address] Telephone: [phone] Email: [email or web address]

## 2023-07-25 ENCOUNTER — Ambulatory Visit: Payer: Commercial Managed Care - HMO | Attending: Cardiology

## 2023-07-25 DIAGNOSIS — Z0000 Encounter for general adult medical examination without abnormal findings: Secondary | ICD-10-CM

## 2023-07-25 NOTE — Progress Notes (Signed)
 HEALTH & WELLNESS COACHING INITIAL INTAKE   Appointment Outcome: Completed, Session #: Initial Start time: 1:34pm    End time: 2:23pm   Total Mins: 49 minutes   What are the Patient's goals from Coaching? Patient wants to start exercising to lose weight to help lower her blood pressure.  Why did they seek coaching now? Patient has not been able to make a commitment to start exercising. Patient is looking for support and accountability to get started with her goal.  Readiness - What stage is the patient in regarding their goal(s)? Patient is in the preparation stage of starting exercising.    Coaching Progress Notes: Patient shared that her only concerns with her eating habits are (1) she may not eat enough because she eats once or twice per day (skips breakfast most times because she is not hungry), and (2) she eats mostly rice and fufu (eat more carbs, don't eat meat every day, do eat chicken/fish and vegetables).  Patient mentioned not being under stress at this time, because when she is stressed, she eats more food.  Patient stated that cooks at home, takes lunch to work from home, and rarely eat outside of home.   Patient reports that she doesn't drink soda and drink water mostly.  Patient stated that she thinks the problem that is causing the weight gain is that she doesn't exercise. Patient stated that she lay down after watching tv when they have finished dinner.   Patient stated that she is missing the commitment to start exercising, although she has told herself that she wants to do it but finds other things to occupy her time or is tired after work.  Patient shared that she has engaged in exercise before and lost weight by walking in the park with a co-worker, but that have different schedules now. Patient mentioned bringing her workout clothes with her to work and going straight to the park to walk after finishing her shift.   Patient had a goal of walking at least 5,000  steps in the park and tracked her steps using an app on her phone.     Coaching Outcomes Patient co-created health coaching agreement based on a previous successful strategy she implemented but got of the routine of doing.  Patient will weigh in every 2 weeks at the beginning of health coaching session to track progress towards weight loss goal.    AGREEMENTS SECTION   Overall Goal(s): Start exercising 3xs/week for 30 minutes and gradually increase to exercising 150 minutes per week to lose at least 25 pounds in 3 months to help lower my blood pressure.           Agreement/Action Steps:  Exercise 3xs/week for 30 minutes Current Plan Walk on Monday after work at 1pm in the park Take workout clothes with you to work Walk on Tuesday and Wednesday at 8am around apartment complex Set alarms for 12:30pm on Monday as a reminder to walk and 7:30am for Tuesday and Wednesday as a reminder to walk Download app to count steps   Patient review of Health Coaching Agreement Reviewed Coaching Agreement and Code of Ethics with Patient during initial session. Answered any questions the patient had if any regarding the Coaching Agreement and Code of Ethics. Patient verbally agreed to adhere to the Coaching Agreement and to abide by the Code of Ethics.  Mailed patient with a hard/electronic copy of the Coaching Agreement and Code of Ethics.    Referrals: N/A  Resources: Automatic email  reminders for exercise motivation.

## 2023-08-08 ENCOUNTER — Ambulatory Visit: Payer: Self-pay | Attending: Cardiology

## 2023-08-08 DIAGNOSIS — Z0000 Encounter for general adult medical examination without abnormal findings: Secondary | ICD-10-CM

## 2023-08-08 NOTE — Progress Notes (Signed)
 Patient sent message asking if she can hold health coaching scheduled today in-person at 2:00pm at [facility] over the phone. See message below.    Date Thu 08/08/2023 2:18 AM  Good morning Jamie Mcbride this is [name] I just want let you know if we can do appointment today over the phone I 'm not feeling well Thank you    Replied and informed patient that her appointment can be held telephonically and that I will her a call at that time.   Renaee Munda, MS, ERHD, [facility]  Care Guide, Health & Wellness Coach [facility] Care Navigation Telephone: [phone] Email: [email or web address]

## 2023-08-08 NOTE — Progress Notes (Signed)
 Appointment Outcome: Completed, Session #: 1                        Start time: 2:04pm   End time: 2:34pm  Total Mins: 30 minutes  AGREEMENTS SECTION   Overall Goal(s): Start exercising 3xs/week for 30 minutes and gradually increase to exercising 150 minutes per week to lose at least 25 pounds in 3 months to help lower my blood pressure.            Agreement/Action Steps:  Exercise 3xs/week for 30 minutes Current Plan Walk on Monday after work at 1pm in the park Take workout clothes with you to work Walk on Tuesday and Wednesday at 8am around apartment complex Set alarms for 12:30pm on Monday as a reminder to walk and 7:30am for Tuesday and Wednesday as a reminder to walk Download app to count steps   Progress Notes:  Patient reported that her challenge to exercise via each planned day over the past two weeks was the changes to her work schedule.   Patient managed to walk the first week as planned but not during week two because of the schedule change that occurred for today's shift.   Patient stated that what she learned is that she does better walking with a partner or exercising in a group.  Patient expressed interest in swimming lessons and learning more about the membership at [employer].   Patient has tried working out at home previously as an alternative to walking and was not able to stick with it because she does not find it interesting to do alone.    Coaching Outcomes:  Patient will continue to walk three days per week based on her schedule and continue to follow the strategies outlined above.   Patient plans to walk this upcoming week on 3/24 (after 1pm), 3/25 (after 1 pm), and 3/29 (after 8am).  Patient work on connecting with accountability partner.  Patient alternative to not being able to walk due to weather is going to [employer].   Patient was provided links to the membership and program page for [employer].  Discussed free apps featured for  free on iPhone that track steps. Patient will preview Health app and Fitness app for iPhone to determine preference to track steps for free.    Attempted: Partial - Patient was able to exercise by walking 5 out of 6 days.     Referrals: N/A  Resources: Links to [employer] and [employer].

## 2023-08-22 ENCOUNTER — Telehealth (HOSPITAL_BASED_OUTPATIENT_CLINIC_OR_DEPARTMENT_OTHER): Payer: Self-pay

## 2023-08-22 ENCOUNTER — Ambulatory Visit (HOSPITAL_BASED_OUTPATIENT_CLINIC_OR_DEPARTMENT_OTHER)

## 2023-08-22 DIAGNOSIS — Z0000 Encounter for general adult medical examination without abnormal findings: Secondary | ICD-10-CM

## 2023-08-22 NOTE — Telephone Encounter (Signed)
 Patient called in to reschedule due to having to travel for a family emergency. Patient requested to be rescheduled for next week. Patient has been rescheduled for 4/10 at 2:00pm for an in-person appointment at [facility]. Patient was made aware of location of appointment.   Renaee Munda, MS, ERHD, [facility]  Care Guide [facility] Care Navigation Telephone: [phone] Email: [email or web address]

## 2023-08-29 ENCOUNTER — Ambulatory Visit: Attending: Cardiovascular Disease

## 2023-08-29 DIAGNOSIS — Z0000 Encounter for general adult medical examination without abnormal findings: Secondary | ICD-10-CM

## 2023-08-29 NOTE — Progress Notes (Signed)
 Appointment Outcome: Completed, Session #: 31-month f/u                       Start time:  11:00am   End time: 11:33am   Total Mins: 33 minutes  AGREEMENTS SECTION   Overall Goal(s): Smoking cessation Increase physical activity Improve eating habits   Agreement/Action Steps: Smoking Cessation Maintain not smoking after finished last 7mg  nicotine patch Utilize support system Use Quit Tracker app   Increase physical activity Exercise on Tues and Thurs for 15-30 mins Aim for a minimum of 8,000 steps during his work and 4,000 steps on off days   Improving eating habits Swap sweet snacks after meals with fruit (apples, grapes, oranges, or tangelos) Research healthy breakfast meals Increase protein intake on exercise days (Tues and Thurs) Drink overnight oats with 20g of protein per serving    Progress Notes:  Patient reported that he has been smoke free for 144 days per his quit tracker app and finished his nicotine patches one month ago. Patient stated that he has been doing well and do not think about smoking unless he is drinking his morning coffee. Patient stated that once the urge to smoke begins, he begins watching tv to distract himself. Patient mentioned that he has not dreamed of himself smoking since the last dream he had.  Patient shared that it does not trigger him to want to smoke when he sees others smoking. Patient mentioned that he does not get an urge to smoke even when he starts feeling upset. Patient expressed that smoking does not cross his mind as it would have previously to help him calm down. Patient stated that his support system has been helpful in encouraging him through the process of smoking cessation.   Patient expressed that he is drinking more water and eating in place of smoking. Patient shared that he continues to take his healthy choice meals to work in addition to fruits such as watermelon, raspberries, strawberries, and bananas. Patient stated there are  times that he still has a challenge with eating sweets. Patient mentioned that one day he ate fruit and apple pie during lunch. Patient expressed that he should not have eaten the pie.   Patient stated that he consumes the overnight oats every other day although he has not been following his exercise schedule. Patient stated that his weight has increased from 170 lbs. to 189 lbs. and feels that it is time that he loses weight. Patient shared that his goal weight is 175 lbs. Patient expressed that he believes it's the types of foods that he has been consuming and not the quantity of food that has led to the weight gain. Patient mentioned that he typically gets breakfast (chicken sandwich with no fries or soda) from [employer] at least twice a week on the way to work but feels that he needs to get out of the routine of eating fast food. Patient stated that when he does eat breakfast at home he enjoys grits, oatmeal, and waffles.  Discussed with patient regarding balanced meals. Discussed with patient about reading food labels to monitor his sodium intake since he eats prepackaged meals regularly and to aid in choosing lower sugar containing foods. Discussed with the idea of practicing portion control with sweets by reading food labels to determine the recommended serving size. Patient stated that he wasn't sure how to reduce his sugar consumption but following the recommended serving size was something that he could start doing.  Patient reported his daily steps while at work and on his off days. Patient has been averaging between 8,600 and 12,000 steps while at work depending on how busy he becomes. Patient is averaging around 5,000 steps on his off days by walking in stores, engaging in housework, or helping his sister with things at her house. Patient stated that he has been stretching every other day before work in place of exercising over the past month.   Patient mentioned that he purchased a treadmill  because he enjoys walking to help with losing weight in addition to a pushup board. Patient stated that he currently completes 30 pushups daily before work. Patient stated that he has a room that he is working on turning into a workout space that will include his weight bench in addition to the other equipment he has purchased. Discussed with patient an ideal workout schedule that he would be able to implement and maintain.   Indicators of Success and Accountability:  Patient has been able to maintain not smoking after completing his prescription of nicotine patches a month ago.  Readiness: Patient is in the action stage of smoking cessation, increasing physical activity, and improving healthy eating habits.  Strengths and Supports: Patient is being supported by his family. Patient is relying on being adaptable and persistent.   Challenges and Barriers: Patient does not foresee any challenges to maintain smoking cessation over the next two months or implementing action steps to increase physical activity and improve healthy eating habits.    Coaching Outcomes: Patient will implement the following strategies over the next two months as outlined below based on the plans that the patient has begun or want to start implementing.    Overall Goal(s): Smoking cessation Increase physical activity Improve eating habits   Agreement/Action Steps: Smoking Cessation Maintain not smoking Utilize support system Use Quit Tracker app   Increase physical activity Exercise for 1 hour after work 2-3 days per week Work out from 12:45-1:45am on T and Th (and Wed if possible) Set up home gym with treadmill, weight bench, and pushup board Aim for a minimum of 8,000 steps during his work and 4,000 steps on off days   Improving eating habits Reduce the amount of sugar consumed daily Swap sweet snacks after meals with fruit Read food labels to eat the recommended serving size Increase protein intake on  exercise days (Tues and Thurs) Drink overnight oats with 20g of protein per serving    Attempted: Fulfilled - Patient was able to maintain not smoking after completing the prescription for nicotine patches over the past month by continuing to utilize his support system and quit tracker app. Patient continues to consume overnight oats to increase his protein intake.  Partial - Patient has been able to reach his step goal of 8,000 steps during workdays and 4,000 steps on off days by engaging in more walking and other physical activities.  Not met - Patient had a challenge with swapping out sweet snacks after meals with fruit.    Not attempted: Revised - Patient determined that he would like to change his exercise routine to lose weight by working out after work for 1 hour between 12:45-1:45am for 2-3 days/week with exercise equipment.

## 2023-09-03 ENCOUNTER — Telehealth: Payer: Self-pay | Admitting: Licensed Clinical Social Worker

## 2023-09-03 NOTE — Telephone Encounter (Signed)
 CSW contacted patient to inform that Amy Merlyn Starring is no longer available to provide Health Coaching. Appointment cancelled for next week. CSW left message for return call if further resources are needed. Aubry Blase, LCSW, CCSW-MCS [phone]

## 2023-09-10 ENCOUNTER — Ambulatory Visit: Payer: Self-pay

## 2023-10-08 ENCOUNTER — Ambulatory Visit: Payer: Commercial Managed Care - HMO | Admitting: Cardiology

## 2023-10-18 NOTE — Progress Notes (Signed)
 Patient was seen by Hamilton Levin, Care Guide although note was completed prior to employee leaving [facility]. As her supervisor, I am closing the encounter. Aubry Blase, LCSW, CCSW-MCS [phone]

## 2023-12-06 ENCOUNTER — Ambulatory Visit: Admitting: Cardiology

## 2024-05-05 ENCOUNTER — Encounter (HOSPITAL_COMMUNITY): Payer: Self-pay

## 2024-05-05 ENCOUNTER — Other Ambulatory Visit: Payer: Self-pay

## 2024-05-05 ENCOUNTER — Emergency Department (HOSPITAL_COMMUNITY)
Admission: EM | Admit: 2024-05-05 | Discharge: 2024-05-05 | Discharge status: Against Medical Advice | Source: Ambulatory Visit

## 2024-05-05 DIAGNOSIS — R42 Dizziness and giddiness: Secondary | ICD-10-CM | POA: Diagnosis present

## 2024-05-05 DIAGNOSIS — I10 Essential (primary) hypertension: Secondary | ICD-10-CM | POA: Diagnosis not present

## 2024-05-05 DIAGNOSIS — Z5321 Procedure and treatment not carried out due to patient leaving prior to being seen by health care provider: Secondary | ICD-10-CM | POA: Diagnosis not present

## 2024-05-05 LAB — CBC
HCT: 40 % (ref 36.0–46.0)
Hemoglobin: 12.8 g/dL (ref 12.0–15.0)
MCH: 21.1 pg — ABNORMAL LOW (ref 26.0–34.0)
MCHC: 32 g/dL (ref 30.0–36.0)
MCV: 65.9 fL — ABNORMAL LOW (ref 80.0–100.0)
Platelets: 134 K/uL — ABNORMAL LOW (ref 150–400)
RBC: 6.07 MIL/uL — ABNORMAL HIGH (ref 3.87–5.11)
RDW: 15.9 % — ABNORMAL HIGH (ref 11.5–15.5)
WBC: 4.8 K/uL (ref 4.0–10.5)
nRBC: 0 % (ref 0.0–0.2)

## 2024-05-05 LAB — BASIC METABOLIC PANEL WITH GFR
Anion gap: 10 (ref 5–15)
BUN: 10 mg/dL (ref 6–20)
CO2: 27 mmol/L (ref 22–32)
Calcium: 9.4 mg/dL (ref 8.9–10.3)
Chloride: 102 mmol/L (ref 98–111)
Creatinine, Ser: 0.69 mg/dL (ref 0.44–1.00)
GFR, Estimated: >60 mL/min (ref >60)
Glucose, Bld: 78 mg/dL (ref 70–99)
Potassium: 3.2 mmol/L — ABNORMAL LOW (ref 3.5–5.1)
Sodium: 139 mmol/L (ref 135–145)

## 2024-05-05 NOTE — ED Notes (Signed)
 PT desires to go home and take her at home BP meds.

## 2024-05-05 NOTE — ED Provider Triage Note (Signed)
 Emergency Medicine Provider Triage Evaluation Note  Jamie Mcbride , a 48 y.o. female  was evaluated in triage.  Pt complains of hypertension.  Patient was at her OB/GYN appointment today when she was instructed to come the emergency department due to elevated BP readings, with systolic readings in the 190s and diastolic readings in the 110s.  Patient notes that she does take amlodipine  and 1 other blood pressure of which she cannot remember the name of, she took these medications at 6 AM.  She reports feeling somewhat lightheaded but denies syncope, no vision changes.  She denies chest pain, shortness of breath, abdominal pain, headache, lower extremity edema.  Review of Systems  Positive: As above Negative: As above  Physical Exam  BP (!) 197/104   Pulse 63   Temp 98 F (36.7 C)   Resp 17   Ht 5' 2 (1.575 m)   Wt 109.5 kg   SpO2 100%   BMI 44.15 kg/m  Gen:   Awake, no distress   Resp:  Normal effort  MSK:   Moves extremities without difficulty  Other:    Medical Decision Making  Medically screening exam initiated at 1:26 PM.  Appropriate orders placed.  Jamie Mcbride was informed that the remainder of the evaluation will be completed by another provider, this initial triage assessment does not replace that evaluation, and the importance of remaining in the ED until their evaluation is complete.     Jamie Mcbride, [place] 05/05/24 1327

## 2024-05-05 NOTE — ED Triage Notes (Signed)
 Pt was at doctor office and sent over for HTN. Pt states 190 systolic. Denies blurred vision, headache, numbness, tingling. C/O dizziness. Axox4. Pt states hx of HTN and took medicine this morning.  Denies SHOB and CP.

## 2024-05-09 ENCOUNTER — Other Ambulatory Visit: Payer: Self-pay | Admitting: Cardiology
